# Patient Record
Sex: Male | Born: 1975 | Race: White | Hispanic: No | Smoking: Current every day smoker
Health system: Southern US, Community
[De-identification: ages and names within clinical notes are randomized; demographics above are authoritative.]

## PROBLEM LIST (undated history)

## (undated) DIAGNOSIS — F909 Attention-deficit hyperactivity disorder, unspecified type: Secondary | ICD-10-CM

## (undated) DIAGNOSIS — F319 Bipolar disorder, unspecified: Secondary | ICD-10-CM

## (undated) DIAGNOSIS — J45909 Unspecified asthma, uncomplicated: Secondary | ICD-10-CM

## (undated) HISTORY — PX: WISDOM TOOTH EXTRACTION: SHX21

---

## 2015-10-14 ENCOUNTER — Encounter: Payer: Self-pay | Admitting: Emergency Medicine

## 2015-10-14 ENCOUNTER — Emergency Department
Admission: EM | Admit: 2015-10-14 | Discharge: 2015-10-14 | Disposition: A | Discharge status: Home / Self Care | Payer: Medicaid Other | Attending: Emergency Medicine | Admitting: Emergency Medicine

## 2015-10-14 ENCOUNTER — Emergency Department: Payer: Medicaid Other

## 2015-10-14 DIAGNOSIS — S93401A Sprain of unspecified ligament of right ankle, initial encounter: Secondary | ICD-10-CM | POA: Diagnosis not present

## 2015-10-14 DIAGNOSIS — F17210 Nicotine dependence, cigarettes, uncomplicated: Secondary | ICD-10-CM | POA: Diagnosis not present

## 2015-10-14 DIAGNOSIS — Y9289 Other specified places as the place of occurrence of the external cause: Secondary | ICD-10-CM | POA: Insufficient documentation

## 2015-10-14 DIAGNOSIS — X58XXXA Exposure to other specified factors, initial encounter: Secondary | ICD-10-CM | POA: Diagnosis not present

## 2015-10-14 DIAGNOSIS — Y9389 Activity, other specified: Secondary | ICD-10-CM | POA: Insufficient documentation

## 2015-10-14 DIAGNOSIS — Y998 Other external cause status: Secondary | ICD-10-CM | POA: Insufficient documentation

## 2015-10-14 DIAGNOSIS — S99911A Unspecified injury of right ankle, initial encounter: Secondary | ICD-10-CM | POA: Diagnosis present

## 2015-10-14 HISTORY — DX: Unspecified asthma, uncomplicated: J45.909

## 2015-10-14 MED ORDER — HYDROCODONE-ACETAMINOPHEN 5-325 MG PO TABS
1.0000 | ORAL_TABLET | Freq: Once | ORAL | Status: AC
  Administered 2015-10-14: 1 via ORAL
  Filled 2015-10-14: qty 6

## 2015-10-14 MED ORDER — IBUPROFEN 800 MG PO TABS
800.0000 mg | ORAL_TABLET | Freq: Once | ORAL | Status: AC
  Administered 2015-10-14: 800 mg via ORAL
  Filled 2015-10-14: qty 1

## 2015-10-14 MED ORDER — IBUPROFEN 800 MG PO TABS: 800.0000 mg | ORAL_TABLET | Freq: Three times a day (TID) | ORAL | Status: DC | PRN

## 2015-10-14 NOTE — ED Notes (Signed)
Pt complains of right ankle pain, no injury to knowledge. Swelling noted, no deformity noted. Pt complains of pain only when weight bearing.

## 2015-10-14 NOTE — ED Provider Notes (Signed)
[facility] Emergency Department Provider Note  ____________________________________________  Time seen: Approximately 9:37 PM  I have reviewed the triage vital signs and the nursing notes.   HISTORY  Chief Complaint Ankle Pain    HPI Ronald Brooks is a 39 y.o. male who presents for evaluation of right ankle pain. No known trauma. Patient states that he is unsure of etiology but it is on his feet for 12 hours yesterday. Complains of pain only when weightbearing.   Past Medical History  Diagnosis Date  . Asthma     There are no active problems to display for this patient.   History reviewed. No pertinent past surgical history.  Current Outpatient Rx  Name  Route  Sig  Dispense  Refill  . ibuprofen (ADVIL,MOTRIN) 800 MG tablet   Oral   Take 1 tablet (800 mg total) by mouth every 8 (eight) hours as needed.   30 tablet   0     Allergies Review of patient's allergies indicates no known allergies.  History reviewed. No pertinent family history.  Social History Social History  Substance Use Topics  . Smoking status: Current Every Day Smoker -- 0.50 packs/day    Types: Cigarettes  . Smokeless tobacco: None  . Alcohol Use: Yes     Comment: occasional     Review of Systems Constitutional: No fever/chills Eyes: No visual changes. ENT: No sore throat. Cardiovascular: Denies chest pain. Respiratory: Denies shortness of breath. Gastrointestinal: No abdominal pain.  No nausea, no vomiting.  No diarrhea.  No constipation. Genitourinary: Negative for dysuria. Musculoskeletal: Positive for right ankle pain. Skin: Negative for rash. Neurological: Negative for headaches, focal weakness or numbness.  10-point ROS otherwise negative.  ____________________________________________   PHYSICAL EXAM:  VITAL SIGNS: ED Triage Vitals  Enc Vitals Group     BP 10/14/15 2049 115/77 mmHg     Pulse Rate 10/14/15 2049 92     Resp 10/14/15 2049 16     Temp 10/14/15 2049 98.2 F (36.8 C)     Temp Source 10/14/15 2049 Oral     SpO2 10/14/15 2049 96 %     Weight 10/14/15 2049 215 lb (97.523 kg)     Height 10/14/15 2049  (1.778 m)     Head Cir --      Peak Flow --      Pain Score 10/14/15 2050 7     Pain Loc --      Pain Edu? --      Excl. in GC? --     Constitutional: Alert and oriented. Well appearing and in no acute distress. Musculoskeletal: No lower extremity tenderness nor edema.  No joint effusions. Point tenderness laterally. Neurologic:  Normal speech and language. No gross focal neurologic deficits are appreciated. No gait instability. Skin:  Skin is warm, dry and intact. No rash noted. Psychiatric: Mood and affect are normal. Speech and behavior are normal.  ____________________________________________   LABS (all labs ordered are listed, but only abnormal results are displayed)  Labs Reviewed - No data to display  RADIOLOGY   ____________________________________________   PROCEDURES  Procedure(s) performed: None  Critical Care performed: No  ____________________________________________   INITIAL IMPRESSION / ASSESSMENT AND PLAN / ED COURSE  Pertinent labs & imaging results that were available during my care of the patient were reviewed by me and considered in my medical decision making (see chart for details).  Acute right ankle strain. Rx given for Motrin 800 mg 3 times a  day. Patient was given a ankle support brace and to follow-up with orthopedics on call. He voices no other emergency medical complaints at this time. ____________________________________________   FINAL CLINICAL IMPRESSION(S) / ED DIAGNOSES  Final diagnoses:  Ankle sprain, right, initial encounter      Evangeline DakinCharles M Laquitha Heslin, PA-C 10/15/15 1628  Rockne MenghiniAnne-Caroline Norman, MD 10/16/15 1553

## 2015-10-14 NOTE — Discharge Instructions (Signed)
Ankle Sprain  An ankle sprain is an injury to the strong, fibrous tissues (ligaments) that hold the bones of your ankle joint together.   CAUSES  An ankle sprain is usually caused by a fall or by twisting your ankle. Ankle sprains most commonly occur when you step on the outer edge of your foot, and your ankle turns inward. People who participate in sports are more prone to these types of injuries.   SYMPTOMS    Pain in your ankle. The pain may be present at rest or only when you are trying to stand or walk.   Swelling.   Bruising. Bruising may develop immediately or within 1 to 2 days after your injury.   Difficulty standing or walking, particularly when turning corners or changing directions.  DIAGNOSIS   Your caregiver will ask you details about your injury and perform a physical exam of your ankle to determine if you have an ankle sprain. During the physical exam, your caregiver will press on and apply pressure to specific areas of your foot and ankle. Your caregiver will try to move your ankle in certain ways. An X-ray exam may be done to be sure a bone was not broken or a ligament did not separate from one of the bones in your ankle (avulsion fracture).   TREATMENT   Certain types of braces can help stabilize your ankle. Your caregiver can make a recommendation for this. Your caregiver may recommend the use of medicine for pain. If your sprain is severe, your caregiver may refer you to a surgeon who helps to restore function to parts of your skeletal system (orthopedist) or a physical therapist.  HOME CARE INSTRUCTIONS    Apply ice to your injury for 1-2 days or as directed by your caregiver. Applying ice helps to reduce inflammation and pain.    Put ice in a plastic bag.    Place a towel between your skin and the bag.    Leave the ice on for 15-20 minutes at a time, every 2 hours while you are awake.   Only take over-the-counter or prescription medicines for pain, discomfort, or fever as directed by  your caregiver.   Elevate your injured ankle above the level of your heart as much as possible for 2-3 days.   If your caregiver recommends crutches, use them as instructed. Gradually put weight on the affected ankle. Continue to use crutches or a cane until you can walk without feeling pain in your ankle.   If you have a plaster splint, wear the splint as directed by your caregiver. Do not rest it on anything harder than a pillow for the first 24 hours. Do not put weight on it. Do not get it wet. You may take it off to take a shower or bath.   You may have been given an elastic bandage to wear around your ankle to provide support. If the elastic bandage is too tight (you have numbness or tingling in your foot or your foot becomes cold and blue), adjust the bandage to make it comfortable.   If you have an air splint, you may blow more air into it or let air out to make it more comfortable. You may take your splint off at night and before taking a shower or bath. Wiggle your toes in the splint several times per day to decrease swelling.  SEEK MEDICAL CARE IF:    You have rapidly increasing bruising or swelling.   Your toes feel   extremely cold or you lose feeling in your foot.   Your pain is not relieved with medicine.  SEEK IMMEDIATE MEDICAL CARE IF:   Your toes are numb or blue.   You have severe pain that is increasing.  MAKE SURE YOU:    Understand these instructions.   Will watch your condition.   Will get help right away if you are not doing well or get worse.     This information is not intended to replace advice given to you by your health care provider. Make sure you discuss any questions you have with your health care provider.     Document Released: 10/25/2005 Document Revised: 11/15/2014 Document Reviewed: 11/06/2011  Elsevier Interactive Patient Education 2016 [employer]

## 2017-02-18 IMAGING — CR DG ANKLE COMPLETE 3+V*R*
1 series · 3 of 3 positions shown · non-contrast
Comparison: None.

CLINICAL DATA: Acute onset of right ankle pain.  Initial encounter.

EXAM:
RIGHT ANKLE - COMPLETE 3+ VIEW

[Series 1: x ankle ap right · 0.14mm/px · 3 of 3 slices shown]
[im 1/3]
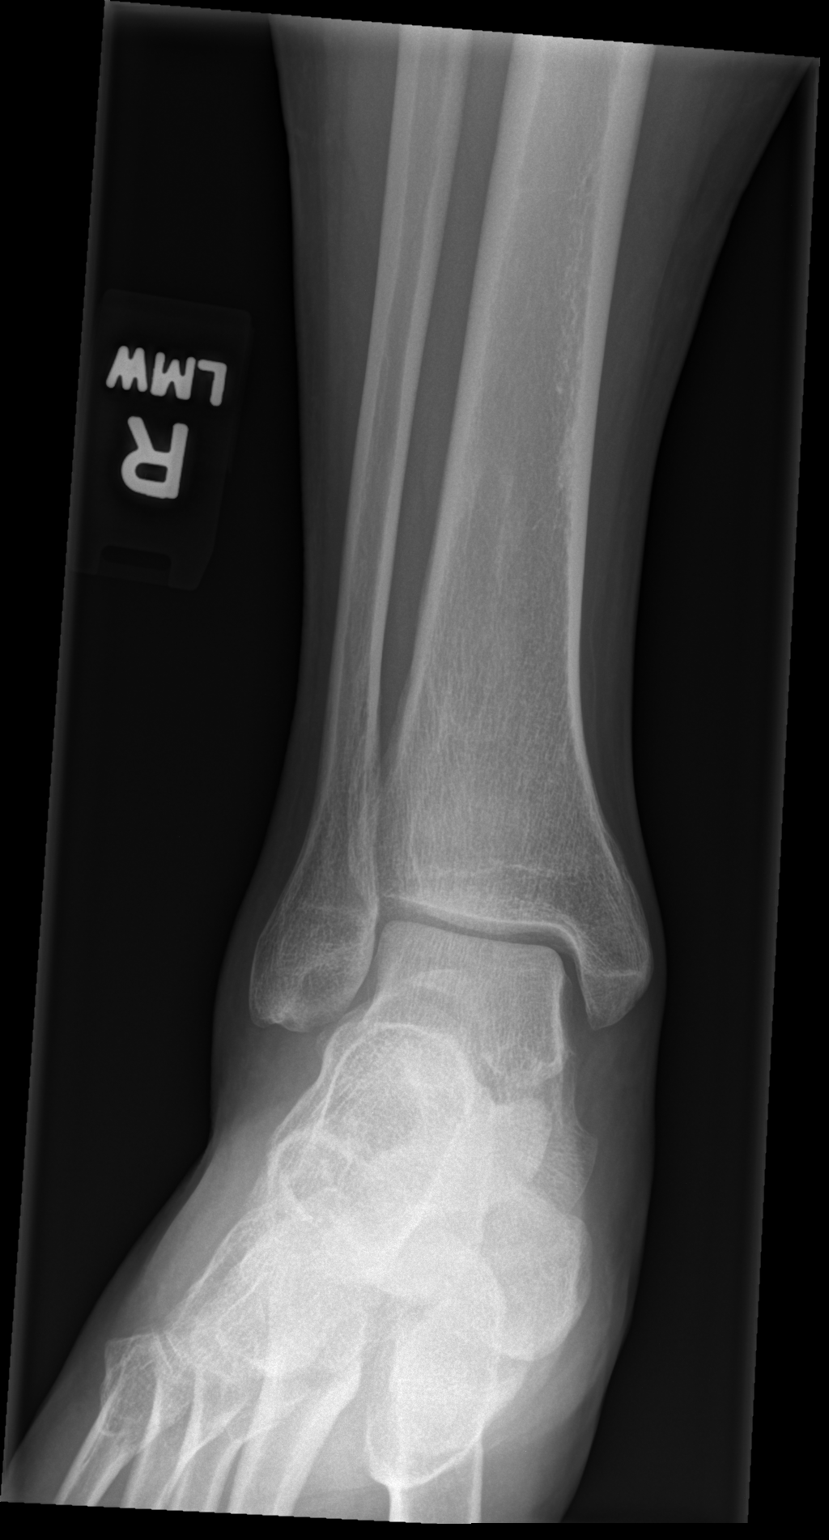
[im 2/3]
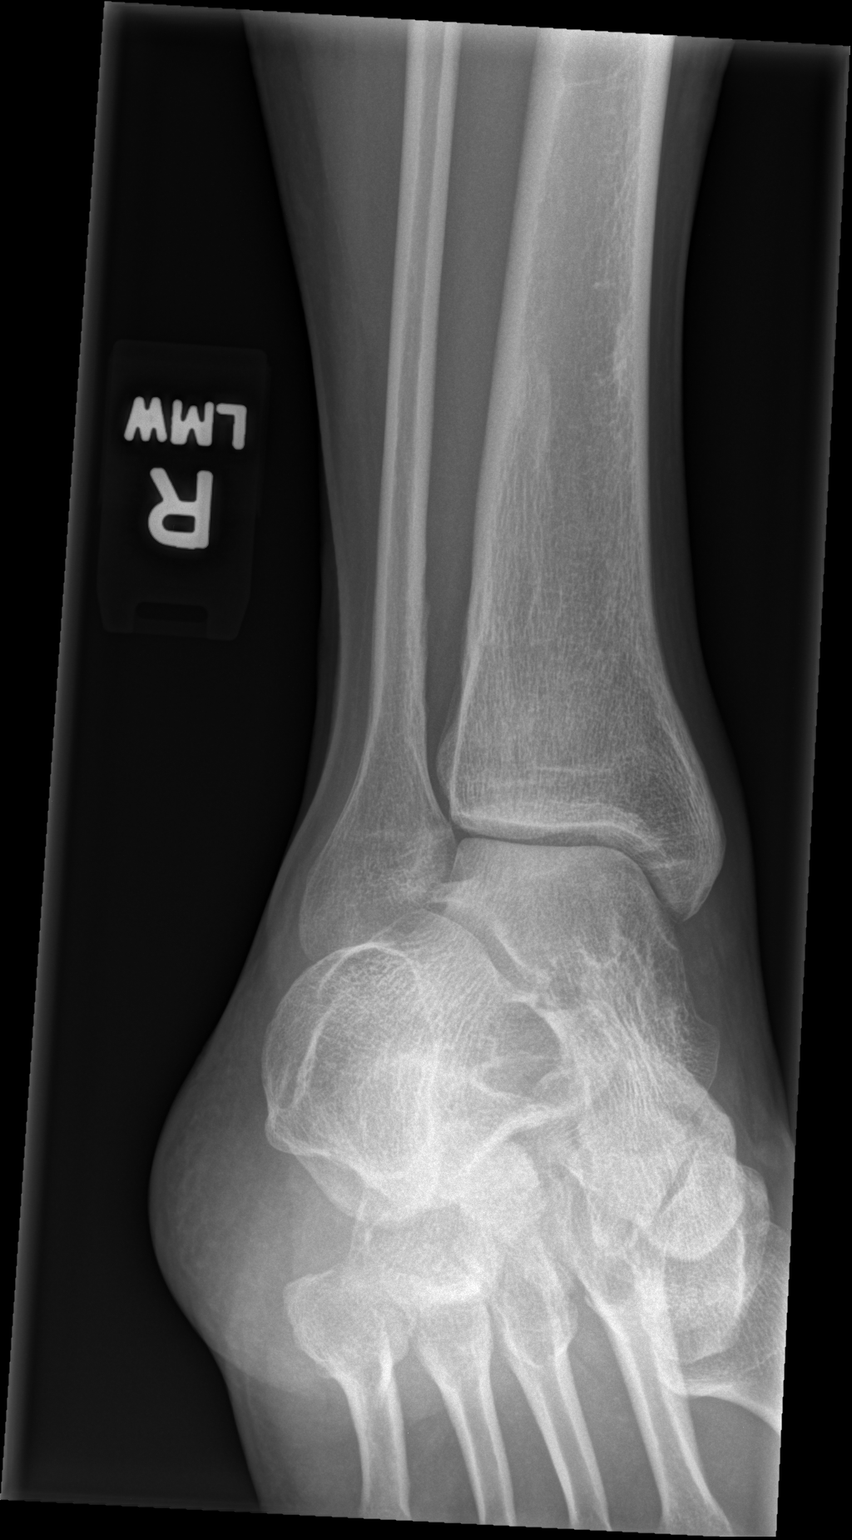
[im 3/3]
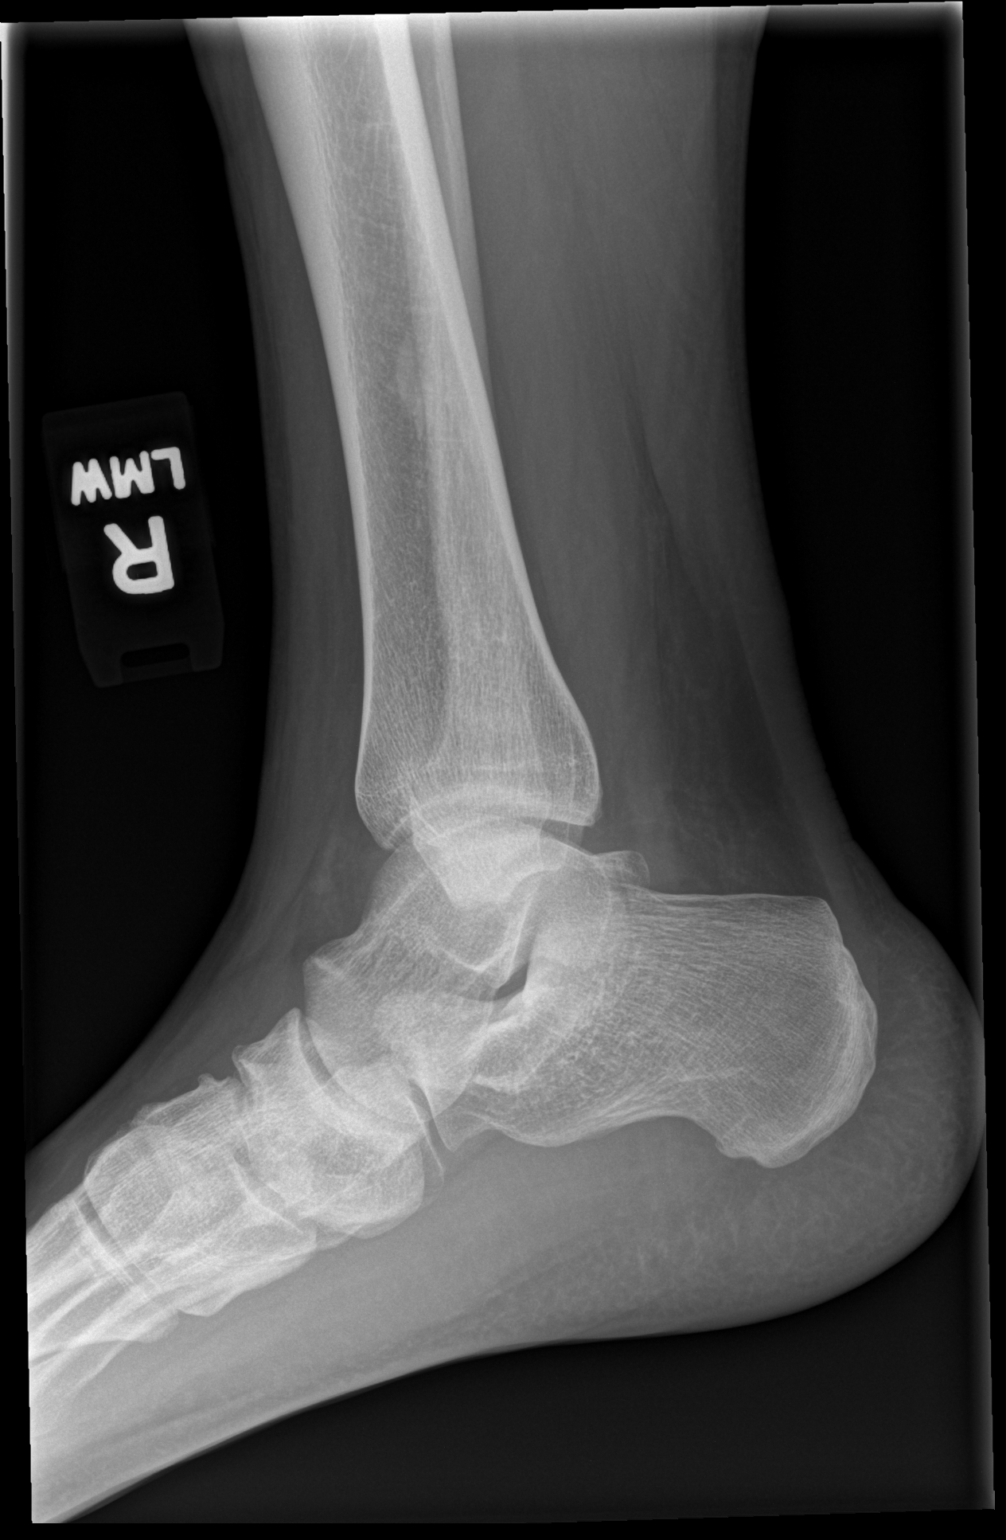

[3 of 3 positions shown; findings below may reference images not displayed]

FINDINGS: There is no evidence of fracture or dislocation. The ankle mortise
is intact; the interosseous space is within normal limits. No talar
tilt or subluxation is seen.

The joint spaces are preserved. Minimal dorsal and lateral soft
tissue swelling is noted.
IMPRESSION: No evidence of fracture or dislocation.

## 2018-09-03 ENCOUNTER — Encounter (HOSPITAL_COMMUNITY): Payer: Self-pay | Admitting: *Deleted

## 2018-09-03 ENCOUNTER — Emergency Department (HOSPITAL_COMMUNITY)
Admission: EM | Admit: 2018-09-03 | Discharge: 2018-09-05 | Disposition: A | Discharge status: Home / Self Care | Payer: Self-pay | Attending: Emergency Medicine | Admitting: Emergency Medicine

## 2018-09-03 ENCOUNTER — Other Ambulatory Visit: Payer: Self-pay

## 2018-09-03 DIAGNOSIS — F17210 Nicotine dependence, cigarettes, uncomplicated: Secondary | ICD-10-CM | POA: Insufficient documentation

## 2018-09-03 DIAGNOSIS — F330 Major depressive disorder, recurrent, mild: Secondary | ICD-10-CM

## 2018-09-03 DIAGNOSIS — J45909 Unspecified asthma, uncomplicated: Secondary | ICD-10-CM | POA: Insufficient documentation

## 2018-09-03 DIAGNOSIS — R45851 Suicidal ideations: Secondary | ICD-10-CM | POA: Insufficient documentation

## 2018-09-03 DIAGNOSIS — F314 Bipolar disorder, current episode depressed, severe, without psychotic features: Secondary | ICD-10-CM | POA: Insufficient documentation

## 2018-09-03 HISTORY — DX: Attention-deficit hyperactivity disorder, unspecified type: F90.9

## 2018-09-03 HISTORY — DX: Bipolar disorder, unspecified: F31.9

## 2018-09-03 LAB — COMPREHENSIVE METABOLIC PANEL
ALBUMIN: 4.4 g/dL (ref 3.5–5.0)
ALK PHOS: 53 U/L (ref 38–126)
ALT: 21 U/L (ref 0–44)
AST: 19 U/L (ref 15–41)
Anion gap: 6 (ref 5–15)
BILIRUBIN TOTAL: 0.5 mg/dL (ref 0.3–1.2)
BUN: 11 mg/dL (ref 6–20)
CALCIUM: 9.1 mg/dL (ref 8.9–10.3)
CO2: 24 mmol/L (ref 22–32)
CREATININE: 0.9 mg/dL (ref 0.61–1.24)
Chloride: 107 mmol/L (ref 98–111)
GFR calc Af Amer: >60 mL/min (ref >60)
GFR calc non Af Amer: >60 mL/min (ref >60)
Glucose, Bld: 88 mg/dL (ref 70–99)
Potassium: 4.4 mmol/L (ref 3.5–5.1)
SODIUM: 137 mmol/L (ref 135–145)
Total Protein: 7.5 g/dL (ref 6.5–8.1)

## 2018-09-03 LAB — CBC
HEMATOCRIT: 44.2 % (ref 39.0–52.0)
HEMOGLOBIN: 14.6 g/dL (ref 13.0–17.0)
MCH: 29.3 pg (ref 26.0–34.0)
MCHC: 33 g/dL (ref 30.0–36.0)
MCV: 88.6 fL (ref 80.0–100.0)
Platelets: 271 10*3/uL (ref 150–400)
RBC: 4.99 MIL/uL (ref 4.22–5.81)
RDW: 13.5 % (ref 11.5–15.5)
WBC: 10.7 10*3/uL — ABNORMAL HIGH (ref 4.0–10.5)
nRBC: 0 % (ref 0.0–0.2)

## 2018-09-03 LAB — RAPID URINE DRUG SCREEN, HOSP PERFORMED
AMPHETAMINES: NOT DETECTED
BARBITURATES: NOT DETECTED
Benzodiazepines: NOT DETECTED
Cocaine: NOT DETECTED
OPIATES: NOT DETECTED
Tetrahydrocannabinol: NOT DETECTED

## 2018-09-03 LAB — ETHANOL: Alcohol, Ethyl (B): <10 mg/dL (ref <10)

## 2018-09-03 LAB — SALICYLATE LEVEL: Salicylate Lvl: <7 mg/dL (ref 2.8–30.0)

## 2018-09-03 LAB — ACETAMINOPHEN LEVEL

## 2018-09-03 MED ORDER — OLANZAPINE 10 MG PO TABS
10.0000 mg | ORAL_TABLET | Freq: Two times a day (BID) | ORAL | Status: DC
  Administered 2018-09-03 – 2018-09-05 (×4): 10 mg via ORAL
  Filled 2018-09-03 (×4): qty 1

## 2018-09-03 NOTE — ED Notes (Signed)
Pt came out of his room abruptly and said that he felt restless and needs to move around. Transferred to the Acute Unit that is open so that he can walk around.
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
## 2018-09-03 NOTE — ED Triage Notes (Signed)
Pt states he is suicidal with a plan to cut self. Denies SI/HI

## 2018-09-03 NOTE — ED Notes (Addendum)
Pt to room # 43. Pt irritable. Informs this nurse that he was discharged from inpatient psychiatric treatment 7 days ago, and then went to a program called "[facility]" and lived at an apartment there, reports he did not like it there so he left. Pt reports he has been off his medication since discharging from the hospital because he can not afford them. Reports hx of bipolar disorder. Pt denies HI/AVH. Encouragement and support provided. Special checks q 15 mins in place for safety, Video monitoring in place. Will continue to monitor.

## 2018-09-03 NOTE — ED Notes (Signed)
Patient reports suicidal ideation with a plan to walk in front of a truck or cut himself. Patient denies HI/AVH. Plan of care discussed. Encouragement and support provided and safety maintain. Q 15 min safety checks remain in place.

## 2018-09-03 NOTE — ED Notes (Signed)
According to pt he was recently at [facility] and he was unable to get his medications filled at [facility]. He said, "You're not kicking me out of here are you?"

## 2018-09-04 ENCOUNTER — Other Ambulatory Visit: Payer: Self-pay

## 2018-09-04 DIAGNOSIS — R45851 Suicidal ideations: Secondary | ICD-10-CM

## 2018-09-04 DIAGNOSIS — F330 Major depressive disorder, recurrent, mild: Secondary | ICD-10-CM

## 2018-09-04 MED ORDER — DIVALPROEX SODIUM 500 MG PO DR TAB
500.0000 mg | DELAYED_RELEASE_TABLET | Freq: Two times a day (BID) | ORAL | Status: DC
  Administered 2018-09-04 – 2018-09-05 (×3): 500 mg via ORAL
  Filled 2018-09-04 (×3): qty 1

## 2018-09-04 NOTE — ED Notes (Signed)
Patient denies pain and is resting comfortably.  

## 2018-09-04 NOTE — Consult Note (Addendum)
[facility] Face-to-Face Psychiatry Consult   Reason for Consult:  Suicidal ideatoins Referring Physician:  EDP Patient Identification: Ronald Brooks MRN:  [id number] Principal Diagnosis: Major depressive disorder, recurrent episode, mild (Ronald Brooks) Diagnosis:   Patient Active Problem List   Diagnosis Date Noted  . Major depressive disorder, recurrent episode, mild (Ronald Brooks) [F33.0] 09/04/2018    Priority: High    Total Time spent with patient: 45 minutes  Subjective:   [name] is a 42 y.o. male patient will be started on medications and re-assessed for stability in the am.  HPI:  42 yo male who presented to the ED with suicidal ideations, intermittent, no plan.  His depression started a couple of months ago and increased recently.  He improved on medications at [facility] and was discharged a week ago but cannot get his medications.  Stress increases his depression and medications decrease it.  3/10 depression today.  Would like to restart his medications with hopes to discharge tomorrow.    Past Psychiatric History: depression  Risk to Self: Suicidal Ideation: Yes-Currently Present Suicidal Intent: Yes-Currently Present Is patient at risk for suicide?: Yes Suicidal Plan?: Yes-Currently Present Specify Current Suicidal Plan: cut self or walk in front of a truck Access to Means: Yes Specify Access to Suicidal Means: environment What has been your use of drugs/alcohol within the last 12 months?: no use in 29 days How many times?: (several) Other Self Harm Risks: none Triggers for Past Attempts: Unpredictable Intentional Self Injurious Behavior: None Risk to Others: Homicidal Ideation: No Thoughts of Harm to Others: No Current Homicidal Intent: No Current Homicidal Plan: No Access to Homicidal Means: No History of harm to others?: Yes("in self defense") Assessment of Violence: In distant past Does patient have access to weapons?: No Criminal Charges Pending?: No Does patient have a  court date: No Prior Inpatient Therapy: Prior Inpatient Therapy: Yes Prior Therapy Dates: discharged  7 days ago Prior Therapy Facilty/Provider(s): [facility] Reason for Treatment: MH and SA Prior Outpatient Therapy: Prior Outpatient Therapy: Yes Prior Therapy Dates: (this week) Prior Therapy Facilty/Provider(s): [facility] Reason for Treatment: MH, SA Does patient have an ACCT team?: No Does patient have Intensive In-House Services?  : No Does patient have [facility] services? : Yes Does patient have P4CC services?: No  Past Medical History:  Past Medical History:  Diagnosis Date  . ADHD   . Asthma   . Bipolar 1 disorder (Ronald Brooks)    History reviewed. No pertinent surgical history. Family History: No family history on file. Family Psychiatric  History: Maternal uncle-BPAD and completed suicide.  Social History:  Social History   Substance and Sexual Activity  Alcohol Use Yes   Comment: occasional      Social History   Substance and Sexual Activity  Drug Use No   Comment: has not used cocain and ETOH for 29 days    Social History   Socioeconomic History  . Marital status: Married    Spouse name: Not on file  . Number of children: Not on file  . Years of education: Not on file  . Highest education level: Not on file  Occupational History  . Not on file  Social Needs  . Financial resource strain: Not on file  . Food insecurity:    Worry: Not on file    Inability: Not on file  . Transportation needs:    Medical: Not on file    Non-medical: Not on file  Tobacco Use  . Smoking status: Current Every Day  Smoker    Packs/day: 0.50    Types: Cigarettes  . Smokeless tobacco: Never Used  Substance and Sexual Activity  . Alcohol use: Yes    Comment: occasional   . Drug use: No    Comment: has not used cocain and ETOH for 29 days  . Sexual activity: Not on file  Lifestyle  . Physical activity:    Days per week: Not on file    Minutes per session: Not on file  . Stress:  Not on file  Relationships  . Social connections:    Talks on phone: Not on file    Gets together: Not on file    Attends religious service: Not on file    Active member of club or organization: Not on file    Attends meetings of clubs or organizations: Not on file    Relationship status: Not on file  Other Topics Concern  . Not on file  Social History Narrative  . Not on file   Additional Social History: He is homeless. He denies alcohol or illicit substance use.     Allergies:  No Known Allergies  Labs:  Results for orders placed or performed during the hospital encounter of 09/03/18 (from the past 48 hour(s))  Comprehensive metabolic panel     Status: None   Collection Time: 09/03/18  3:22 PM  Result Value Ref Range   Sodium 137 135 - 145 mmol/L   Potassium 4.4 3.5 - 5.1 mmol/L   Chloride 107 98 - 111 mmol/L   CO2 24 22 - 32 mmol/L   Glucose, Bld 88 70 - 99 mg/dL   BUN 11 6 - 20 mg/dL   Creatinine, Ser 0.90 0.61 - 1.24 mg/dL   Calcium 9.1 8.9 - 10.3 mg/dL   Total Protein 7.5 6.5 - 8.1 g/dL   Albumin 4.4 3.5 - 5.0 g/dL   AST 19 15 - 41 U/L   ALT 21 0 - 44 U/L   Alkaline Phosphatase 53 38 - 126 U/L   Total Bilirubin 0.5 0.3 - 1.2 mg/dL   GFR calc non Af Amer >60 >60 mL/min   GFR calc Af Amer >60 >60 mL/min    Comment: (NOTE) The eGFR has been calculated using the CKD EPI equation. This calculation has not been validated in all clinical situations. eGFR's persistently <60 mL/min signify possible Chronic Kidney Disease.    Anion gap 6 5 - 15    Comment: Performed at [facility], [place] [address], [place], [place] [address]  Ethanol     Status: None   Collection Time: 09/03/18  3:22 PM  Result Value Ref Range   Alcohol, Ethyl (B) <10 <10 mg/dL    Comment: (NOTE) Lowest detectable limit for serum alcohol is 10 mg/dL. For medical purposes only. Performed at [facility], [place] [address], [place], [place] [address]   Salicylate  level     Status: None   Collection Time: 09/03/18  3:22 PM  Result Value Ref Range   Salicylate Lvl <4.6 2.8 - 30.0 mg/dL    Comment: Performed at [facility] [place] [address], [place], [place] [address]  Acetaminophen level     Status: Abnormal   Collection Time: 09/03/18  3:22 PM  Result Value Ref Range   Acetaminophen (Tylenol), Serum <10 (L) 10 - 30 ug/mL    Comment: (NOTE) Therapeutic concentrations vary significantly. A range of 10-30 ug/mL  may be an effective concentration for many patients.  However, some  are best treated at concentrations outside of this range. Acetaminophen concentrations >150 ug/mL at 4 hours after ingestion  and >50 ug/mL at 12 hours after ingestion are often associated with  toxic reactions. Performed at [facility], [place] [address], [place], [place] [address]   cbc     Status: Abnormal   Collection Time: 09/03/18  3:22 PM  Result Value Ref Range   WBC 10.7 (H) 4.0 - 10.5 K/uL   RBC 4.99 4.22 - 5.81 MIL/uL   Hemoglobin 14.6 13.0 - 17.0 g/dL   HCT 44.2 39.0 - 52.0 %   MCV 88.6 80.0 - 100.0 fL   MCH 29.3 26.0 - 34.0 pg   MCHC 33.0 30.0 - 36.0 g/dL   RDW 13.5 11.5 - 15.5 %   Platelets 271 150 - 400 K/uL   nRBC 0.0 0.0 - 0.2 %    Comment: Performed at [facility], [place] [address], [place], [place] [address]  Rapid urine drug screen (hospital performed)     Status: None   Collection Time: 09/03/18  3:22 PM  Result Value Ref Range   Opiates NONE DETECTED NONE DETECTED   Cocaine NONE DETECTED NONE DETECTED   Benzodiazepines NONE DETECTED NONE DETECTED   Amphetamines NONE DETECTED NONE DETECTED   Tetrahydrocannabinol NONE DETECTED NONE DETECTED   Barbiturates NONE DETECTED NONE DETECTED    Comment: (NOTE) DRUG SCREEN FOR MEDICAL PURPOSES ONLY.  IF CONFIRMATION IS NEEDED FOR ANY PURPOSE, NOTIFY LAB WITHIN 5 DAYS. LOWEST DETECTABLE LIMITS FOR URINE DRUG SCREEN Drug Class                      Cutoff (ng/mL) Amphetamine and metabolites    1000 Barbiturate and metabolites    200 Benzodiazepine                 098 Tricyclics and metabolites     300 Opiates and metabolites        300 Cocaine and metabolites        300 THC                            50 Performed at [facility], [place] [address] [place], [place] [address]     Current Facility-Administered Medications  Medication Dose Route Frequency Provider Last Rate Last Dose  . OLANZapine (ZYPREXA) tablet 10 mg  10 mg Oral BID Isla Pence, MD   10 mg at 09/04/18 1018   Current Outpatient Medications  Medication Sig Dispense Refill  . ibuprofen (ADVIL,MOTRIN) 800 MG tablet Take 1 tablet (800 mg total) by mouth every 8 (eight) hours as needed. (Patient not taking: Reported on 09/03/2018) 30 tablet 0    Musculoskeletal: Strength & Muscle Tone: within normal limits Gait & Station: normal Patient leans: N/A  Psychiatric Specialty Exam: Physical Exam  Nursing note and vitals reviewed. Constitutional: He is oriented to person, place, and time. He appears well-developed and well-nourished.  HENT:  Head: Normocephalic and atraumatic.  Neck: Normal range of motion.  Respiratory: Effort normal.  Musculoskeletal: Normal range of motion.  Neurological: He is alert and oriented to person, place, and time.  Psychiatric: His speech is normal and behavior is normal. Judgment and thought content normal. Cognition and memory are normal. He exhibits a depressed mood.    Review of Systems  Psychiatric/Behavioral: Positive for depression.  All other systems reviewed and are negative.  Blood pressure 120/83, pulse 64, temperature 97.8 F (36.6 C), temperature source Oral, resp. rate 16, height 5' 10.5" (1.791 m), weight 90.7 kg, SpO2 94 %.Body mass index is 28.29 kg/m.  General Appearance: Casual  Eye Contact:  Good  Speech:  Normal Rate  Volume:  Normal  Mood:  Depressed  Affect:  Congruent  Thought  Process:  Coherent and Descriptions of Associations: Intact  Orientation:  Full (Time, Place, and Person)  Thought Content:  WDL and Logical  Suicidal Thoughts:  Yes.  with intent/plan  Homicidal Thoughts:  No  Memory:  Immediate;   Good Recent;   Good Remote;   Good  Judgement:  Fair  Insight:  Fair  Psychomotor Activity:  Normal  Concentration:  Concentration: Good and Attention Span: Good  Recall:  Good  Fund of Knowledge:  Fair  Language:  Good  Akathisia:  No  Handed:  Right  AIMS (if indicated):   N/A  Assets:  Leisure Time Physical Health Resilience Social Support  ADL's:  Intact  Cognition:  WNL  Sleep:   N/A     Treatment Plan Summary: Daily contact with patient to assess and evaluate symptoms and progress in treatment, Medication management and Plan major depressive disorder, recurrent, mild with no psychosis:  -Started Depakote 500 mg BID for mood stabilization -Started Zyprexa 10 mg BID for mood stabilization -Will monitor overnight for safety.  Disposition: Supportive therapy provided about ongoing stressors.  Waylan Boga, NP 09/04/2018 11:06 AM   Patient seen face-to-face for psychiatric evaluation, chart reviewed and case discussed with the physician extender and developed treatment plan. Reviewed the information documented and agree with the treatment plan.  Buford Dresser, DO 09/04/18 3:21 PM

## 2018-09-04 NOTE — ED Notes (Signed)
Continues to have SI (thoughts), plan to a run in traffic or cut self. "I have been off my meds, in awhile."  He takes depakote and he does not remember the other meds. He has been out of meds r/t no funds. Denies HI/AVH.

## 2018-09-04 NOTE — ED Notes (Signed)
Patient continues to endorse SI but denies HI/AVH. Plan of care discussed. Encouragement and support provided and safety maintain. Q 15 min safety checks in place and video monitoring.

## 2018-09-04 NOTE — BH Assessment (Signed)
[facility] Assessment Progress Note  Per Juanetta Beets, DO, this voluntary pt is to be held at [facility] overnight for further observation and stabilization.  Pt's nurse, Angelique Blonder, has been notified.  Doylene Canning, [place] Behavioral Health Coordinator [phone]

## 2018-09-04 NOTE — ED Notes (Signed)
Pt will stay overnight for observation and med compliance

## 2018-09-05 DIAGNOSIS — R4587 Impulsiveness: Secondary | ICD-10-CM

## 2018-09-05 MED ORDER — OLANZAPINE 10 MG PO TABS: 10.0000 mg | ORAL_TABLET | Freq: Two times a day (BID) | ORAL | 0 refills | Status: DC

## 2018-09-05 MED ORDER — DIVALPROEX SODIUM 500 MG PO DR TAB: 500.0000 mg | DELAYED_RELEASE_TABLET | Freq: Two times a day (BID) | ORAL | 0 refills | Status: DC

## 2018-09-05 NOTE — ED Notes (Signed)
Patient denies pain and is resting comfortably.  

## 2018-09-05 NOTE — ED Notes (Signed)
he was given prescription and bus pass transporting him to [place].

## 2018-09-05 NOTE — BH Assessment (Signed)
[facility] Assessment Progress Note  Per Juanetta Beets, DO, this pt does not require psychiatric hospitalization at this time.  Pt is to be discharged from [facility] with recommendation to follow up with [facility].  This has been included in pt's discharge instructions.  Archie Balboa, LCSW agrees to provide pt with information regarding supportive services for the homeless.  Pt's nurse, Angelique Blonder, has been notified.  Doylene Canning, MA Triage Specialist [phone]

## 2018-09-05 NOTE — ED Notes (Signed)
Notes "SI, but contracts to safety." Pt notes he slept well throughout the night.

## 2018-09-05 NOTE — Consult Note (Addendum)
[facility] Psych ED Discharge  09/05/2018 10:50 AM Ronald Brooks  MRN:  [id number] Principal Problem: Major depressive disorder, recurrent episode, mild (HCC) Discharge Diagnoses:  Patient Active Problem List   Diagnosis Date Noted  . Major depressive disorder, recurrent episode, mild (HCC) [F33.0] 09/04/2018    Subjective:  Ronald Brooks presented to the hospital with SI in the setting of homelessness and discontinuing his medications since he was unable to afford them. He was restarted on his home medications yesterday and will be given resources for shelters. He does not endorse SI, HI or AVH. He is safe for discharge and will follow up with [facility].   Total Time spent with patient: 30 minutes  Past Psychiatric History: BPAD type 1 and ADHD.   Past Medical History:  Past Medical History:  Diagnosis Date  . ADHD   . Asthma   . Bipolar 1 disorder (HCC)    History reviewed. No pertinent surgical history. Family History: No family history on file. Family Psychiatric  History: Maternal uncle-BPAD and completed suicide.  Social History:  Social History   Substance and Sexual Activity  Alcohol Use Yes   Comment: occasional     Social History   Substance and Sexual Activity  Drug Use No   Comment: has not used cocain and ETOH for 29 days   Social History   Socioeconomic History  . Marital status: Married    Spouse name: Not on file  . Number of children: Not on file  . Years of education: Not on file  . Highest education level: Not on file  Occupational History  . Not on file  Social Needs  . Financial resource strain: Not on file  . Food insecurity:    Worry: Not on file    Inability: Not on file  . Transportation needs:    Medical: Not on file    Non-medical: Not on file  Tobacco Use  . Smoking status: Current Every Day Smoker    Packs/day: 0.50    Types: Cigarettes  . Smokeless tobacco: Never Used  Substance and Sexual Activity  . Alcohol use: Yes    Comment:  occasional   . Drug use: No    Comment: has not used cocain and ETOH for 29 days  . Sexual activity: Not on file  Lifestyle  . Physical activity:    Days per week: Not on file    Minutes per session: Not on file  . Stress: Not on file  Relationships  . Social connections:    Talks on phone: Not on file    Gets together: Not on file    Attends religious service: Not on file    Active member of club or organization: Not on file    Attends meetings of clubs or organizations: Not on file    Relationship status: Not on file  Other Topics Concern  . Not on file  Social History Narrative  . Not on file    Has this patient used any form of tobacco in the last 30 days? (Cigarettes, Smokeless Tobacco, Cigars, and/or Pipes) Prescription not provided because: N/A  Current Medications: Current Facility-Administered Medications  Medication Dose Route Frequency Provider Last Rate Last Dose  . divalproex (DEPAKOTE) DR tablet 500 mg  500 mg Oral Q12H Charm Rings, NP   500 mg at 09/05/18 1019  . OLANZapine (ZYPREXA) tablet 10 mg  10 mg Oral BID Jacalyn Lefevre, MD   10 mg at 09/05/18 1019   Current Outpatient  Medications  Medication Sig Dispense Refill  . divalproex (DEPAKOTE) 500 MG DR tablet Take 1 tablet (500 mg total) by mouth every 12 (twelve) hours. 10 tablet 0  . OLANZapine (ZYPREXA) 10 MG tablet Take 1 tablet (10 mg total) by mouth 2 (two) times daily. 10 tablet 0   PTA Medications:  (Not in a hospital admission)  Musculoskeletal: Strength & Muscle Tone: within normal limits Gait & Station: normal Patient leans: N/A  Psychiatric Specialty Exam: Physical Exam  Nursing note and vitals reviewed. Constitutional: He is oriented to person, place, and time. He appears well-developed and well-nourished.  HENT:  Head: Normocephalic and atraumatic.  Neck: Normal range of motion.  Respiratory: Effort normal.  Musculoskeletal: Normal range of motion.  Neurological: He is alert and  oriented to person, place, and time.  Psychiatric: His speech is normal and behavior is normal. Judgment and thought content normal. Cognition and memory are normal. He exhibits a depressed mood.    Review of Systems  Psychiatric/Behavioral: Negative for substance abuse and suicidal ideas. The patient has insomnia.   All other systems reviewed and are negative.   Blood pressure 130/89, pulse 82, temperature 97.6 F (36.4 C), temperature source Oral, resp. rate 20, height 5' 10.5" (1.791 m), weight 90.7 kg, SpO2 98 %.Body mass index is 28.29 kg/m.  General Appearance: Fairly Groomed, middle aged, Caucasian male, wearing paper hospital scrubs and lying in bed. NAD.   Eye Contact:  Good  Speech:  Clear and Coherent and Normal Rate  Volume:  Normal  Mood:  Depressed  Affect:  Constricted  Thought Process:  Goal Directed, Linear and Descriptions of Associations: Intact  Orientation:  Full (Time, Place, and Person)  Thought Content:  Logical  Suicidal Thoughts:  No  Homicidal Thoughts:  No  Memory:  Immediate;   Good Recent;   Good Remote;   Good  Judgement:  Fair  Insight:  Fair  Psychomotor Activity:  Normal  Concentration:  Concentration: Good and Attention Span: Good  Recall:  Good  Fund of Knowledge:  Good  Language:  Good  Akathisia:  No  Handed:  Right  AIMS (if indicated):   N/A  Assets:  Communication Skills  ADL's:  Intact  Cognition:  WNL  Sleep:   Fair     Demographic Factors:  Male, Caucasian, Low socioeconomic status and Unemployed  Loss Factors: Financial problems/change in socioeconomic status  Historical Factors: Prior suicide attempts, Family history of suicide and Impulsivity  Risk Reduction Factors:   Positive therapeutic relationship  Continued Clinical Symptoms:  Depression:   Impulsivity  Cognitive Features That Contribute To Risk:  None    Suicide Risk:  Minimal: No identifiable suicidal ideation.  Patients presenting with no risk factors  but with morbid ruminations; may be classified as minimal risk based on the severity of the depressive symptoms  Follow-up Information    Place[place], Chales Abrahams, NP Follow up.   Why:  Please go see Chales Abrahams at the [facility].  She can become your primary doctor and help with all kinds of other things. Contact information: [address] [place] [address] [phone]           Plan Of Care/Follow-up recommendations:  -Continue Depakote 500 mg BID for mood stabilization. -Continue Zyprexa 10 mg BID for mood stabilization. -Patient will follow up with [facility] for further medication management.   -SW will provide patient with shelter resources.   Disposition: Discharge home.  Cherly Beach, DO 09/05/2018, 10:50 AM

## 2018-09-05 NOTE — Discharge Instructions (Signed)
For your mental health needs, you are advised to follow up with [facility].  New and returning patients are seen at their walk-in clinic.  Walk-in hours are Monday - Friday from 8:00 am - 3:00 pm.  Walk-in patients are seen on a first come, first served basis.  Try to arrive as early as possible for he best chance of being seen the same day: ° °     [facility] °     [address] °     [place], [place] [address] °     [phone] °

## 2018-09-05 NOTE — ED Notes (Signed)
Pt was educated on discharge instructions and follow up appointment. He was given bus. Dr. [name] educated pt on resources. He noted that he will use those resources. Social worker spoke with patient, as well. Pt has all belongings.

## 2019-09-14 ENCOUNTER — Encounter (HOSPITAL_COMMUNITY): Payer: Self-pay | Admitting: Emergency Medicine

## 2019-09-14 ENCOUNTER — Other Ambulatory Visit: Payer: Self-pay

## 2019-09-14 ENCOUNTER — Emergency Department (HOSPITAL_COMMUNITY)
Admission: EM | Admit: 2019-09-14 | Discharge: 2019-09-14 | Disposition: A | Discharge status: Other Acute Inpatient Hospital | Payer: Self-pay | Attending: Emergency Medicine | Admitting: Emergency Medicine

## 2019-09-14 DIAGNOSIS — R45851 Suicidal ideations: Secondary | ICD-10-CM | POA: Insufficient documentation

## 2019-09-14 DIAGNOSIS — Z79899 Other long term (current) drug therapy: Secondary | ICD-10-CM | POA: Insufficient documentation

## 2019-09-14 DIAGNOSIS — J45909 Unspecified asthma, uncomplicated: Secondary | ICD-10-CM | POA: Insufficient documentation

## 2019-09-14 DIAGNOSIS — Z20828 Contact with and (suspected) exposure to other viral communicable diseases: Secondary | ICD-10-CM | POA: Insufficient documentation

## 2019-09-14 DIAGNOSIS — F1410 Cocaine abuse, uncomplicated: Secondary | ICD-10-CM

## 2019-09-14 DIAGNOSIS — F17210 Nicotine dependence, cigarettes, uncomplicated: Secondary | ICD-10-CM | POA: Insufficient documentation

## 2019-09-14 DIAGNOSIS — F319 Bipolar disorder, unspecified: Secondary | ICD-10-CM | POA: Insufficient documentation

## 2019-09-14 LAB — COMPREHENSIVE METABOLIC PANEL
ALT: 19 U/L (ref 0–44)
AST: 24 U/L (ref 15–41)
Albumin: 4.2 g/dL (ref 3.5–5.0)
Alkaline Phosphatase: 48 U/L (ref 38–126)
Anion gap: 13 (ref 5–15)
BUN: 16 mg/dL (ref 6–20)
CO2: 20 mmol/L — ABNORMAL LOW (ref 22–32)
Calcium: 9.3 mg/dL (ref 8.9–10.3)
Chloride: 104 mmol/L (ref 98–111)
Creatinine, Ser: 0.98 mg/dL (ref 0.61–1.24)
GFR calc Af Amer: >60 mL/min (ref >60)
GFR calc non Af Amer: >60 mL/min (ref >60)
Glucose, Bld: 161 mg/dL — ABNORMAL HIGH (ref 70–99)
Potassium: 3.7 mmol/L (ref 3.5–5.1)
Sodium: 137 mmol/L (ref 135–145)
Total Bilirubin: 0.7 mg/dL (ref 0.3–1.2)
Total Protein: 7.6 g/dL (ref 6.5–8.1)

## 2019-09-14 LAB — RAPID URINE DRUG SCREEN, HOSP PERFORMED
Amphetamines: NOT DETECTED
Barbiturates: NOT DETECTED
Benzodiazepines: NOT DETECTED
Cocaine: POSITIVE — AB
Opiates: NOT DETECTED
Tetrahydrocannabinol: NOT DETECTED

## 2019-09-14 LAB — CBC
HCT: 44.9 % (ref 39.0–52.0)
Hemoglobin: 14.7 g/dL (ref 13.0–17.0)
MCH: 29.6 pg (ref 26.0–34.0)
MCHC: 32.7 g/dL (ref 30.0–36.0)
MCV: 90.3 fL (ref 80.0–100.0)
Platelets: 409 10*3/uL — ABNORMAL HIGH (ref 150–400)
RBC: 4.97 MIL/uL (ref 4.22–5.81)
RDW: 12.8 % (ref 11.5–15.5)
WBC: 17.3 10*3/uL — ABNORMAL HIGH (ref 4.0–10.5)
nRBC: 0 % (ref 0.0–0.2)

## 2019-09-14 LAB — SARS CORONAVIRUS 2 (TAT 6-24 HRS): SARS Coronavirus 2: NEGATIVE

## 2019-09-14 LAB — ETHANOL: Alcohol, Ethyl (B): <10 mg/dL (ref <10)

## 2019-09-14 LAB — ACETAMINOPHEN LEVEL: Acetaminophen (Tylenol), Serum: <10 ug/mL — ABNORMAL LOW (ref 10–30)

## 2019-09-14 LAB — SALICYLATE LEVEL: Salicylate Lvl: <7 mg/dL (ref 2.8–30.0)

## 2019-09-14 MED ORDER — ALUM & MAG HYDROXIDE-SIMETH 200-200-20 MG/5ML PO SUSP: 30.0000 mL | Freq: Four times a day (QID) | ORAL | Status: DC | PRN

## 2019-09-14 MED ORDER — DIVALPROEX SODIUM 500 MG PO DR TAB
500.0000 mg | DELAYED_RELEASE_TABLET | Freq: Two times a day (BID) | ORAL | Status: DC
  Administered 2019-09-14 (×2): 500 mg via ORAL
  Filled 2019-09-14 (×2): qty 1

## 2019-09-14 MED ORDER — DIVALPROEX SODIUM 500 MG PO DR TAB
500.0000 mg | DELAYED_RELEASE_TABLET | Freq: Two times a day (BID) | ORAL | Status: DC
  Administered 2019-09-14: 500 mg via ORAL
  Filled 2019-09-14: qty 1

## 2019-09-14 MED ORDER — OLANZAPINE 10 MG PO TABS
10.0000 mg | ORAL_TABLET | Freq: Two times a day (BID) | ORAL | Status: DC
  Administered 2019-09-14 (×2): 10 mg via ORAL
  Filled 2019-09-14 (×2): qty 1

## 2019-09-14 MED ORDER — ACETAMINOPHEN 325 MG PO TABS: 650.0000 mg | ORAL_TABLET | ORAL | Status: DC | PRN

## 2019-09-14 MED ORDER — ONDANSETRON HCL 4 MG PO TABS: 4.0000 mg | ORAL_TABLET | Freq: Three times a day (TID) | ORAL | Status: DC | PRN

## 2019-09-14 MED ORDER — MELATONIN 3 MG PO TABS: 3.0000 mg | ORAL_TABLET | Freq: Every evening | ORAL | Status: DC | PRN

## 2019-09-14 MED ORDER — OLANZAPINE 10 MG PO TABS
10.0000 mg | ORAL_TABLET | Freq: Two times a day (BID) | ORAL | Status: DC
  Administered 2019-09-14: 10 mg via ORAL
  Filled 2019-09-14: qty 1

## 2019-09-14 NOTE — ED Notes (Signed)
Voluntary consent signed by patient and witnessed by this nurse. Consent faxed to [facility] and original placed in patient folder to be sent when patient transfes.

## 2019-09-14 NOTE — ED Notes (Signed)
Called transport [phone]   pt moving to [facility]

## 2019-09-14 NOTE — ED Notes (Signed)
Called report to [facility] adult unit

## 2019-09-14 NOTE — BH Assessment (Signed)
[facility] Assessment Progress Note  Per Letitia Libra, FNP, this pt is to be transferred to the [facility] Observation Unit at this time.  Heather, RN has assigned pt to Obs Rm 204.  [facility] will be ready to receive pt after 19:00.  Pt has signed Voluntary Admission and Consent for Treatment and signed form has been faxed to [facility].  Pt's nurse has been notified, and agrees to send original paperwork along with pt via [facility], and to call report to [phone].  Jalene Mullet, [place] Coordinator [phone]

## 2019-09-14 NOTE — Patient Outreach (Addendum)
CPSS tried to meet with the patient again in order to complete the CPSS assessment and talk to the patient about available substance use recovery resources, but the patient is still unable to participate in the CPSS assessment. Patient is still very sleepy at this time. CPSS already provided the patient with information for substance use recovery resources today 09/14/19 at around 12:20pm and left these resources at bedside. Patient is still being  recommended for an inpatient psychiatric hospital admission at this time.

## 2019-09-14 NOTE — Patient Outreach (Signed)
CPSS tried meeting with the patient for the third time today 09/14/19 and the patient was still unable to participate in the CPSS assessment. Patient is still very sleepy at this time. Patient stated that wanted to continue to sleep. Substance use recovery resources were already provided at beside including residential/outpatient substance use treatment center list, NA/AA meeting list, and CPSS contact information.

## 2019-09-14 NOTE — ED Notes (Signed)
Pt belongings have been moved to [employer] [facility]

## 2019-09-14 NOTE — BHH Counselor (Signed)
Clinician to assess pt once he's been seen by EDP and medically cleared.    Vertell Novak, [employer], [facility], [facility] Triage Specialist [phone]

## 2019-09-14 NOTE — BHH Counselor (Signed)
Pt denies, having family, friend supports.   Teshara Moree D Rael Tilly, MS, LCMHC, CRC Triage Specialist [phone]  
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
## 2019-09-14 NOTE — ED Notes (Signed)
[facility] called  Pt to go to room 302  Bed 1 Dr. Mallie Darting

## 2019-09-14 NOTE — Progress Notes (Signed)
Received Ronald Brooks from the main ED an escort, he was oriented to his new environment. He is angry within related to personal stressors and being unable to sleep for days. He was given a dose of his prior medications. The PA arrival to talk with patient. He eventually drifted off to sleep after receiving a  Snack.

## 2019-09-14 NOTE — Consult Note (Signed)
Telepsych Consultation   Reason for Consult:  Suicidal Ideations Referring Physician:  EDP Location of Patient: [name] Location of Provider: [facility]  Patient Identification: Ronald Brooks MRN:  [id number] Principal Diagnosis: Cocaine use disorder (HCC) Diagnosis:  Principal Problem:   Cocaine use disorder (HCC)   Total Time spent with patient: 30 minutes  Subjective:   Ronald Brooks is a 43 y.o. male patient admitted with suicidal ideations. Patient alert and oriented for assessment. Patient irritable, verbalizes "Are you going to discharge me?" Patient endorses chronic suicidal thoughts "for a few months."  Patient endorses plan to cut self. Patient denies homicidal ideations, denies hallucinations. Patient endorses one previous suicide attempt 1.5 years ago when he cut himself. Patient reports inpatient at [facility] one month ago, left [facility] went to [place] for 9 days, most recently at [facility] 2 weeks ago. Patient stressors include "I stay in transitional housing with a bunch of addicts and I am not allowed back there, I used last night." Patient recently in mentorship program in [place], dismissed from program. 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 Patient states Depakote and Zyprexa have worked well in the past.   HPI:  Per TTS assessment: Pt reported, he thought about taking a knife going into his room to cut himself so he can get peace. Pt reported, he thought about cutting himself so he can pass out and get some sleep. Pt reported, "I want to go to sleep and never wake up." Pt reported, he's been out of his medications for two months (Depakote and Zyprexa). Pt reported, he goes from depressed to manic. Pt reported, his  symptoms manifest physically, he was sick the week before last. Pt reported, he cut himself a year and a half ago, as a suicide attempt. Pt reported, access to knives. Pt reported, denies, HI, AVH, and current self-injurious behaviors.   Past Psychiatric History: Bipolar Disorder (per patient)  Risk to Self: Suicidal Ideation: Yes-Currently Present Suicidal Intent: Yes-Currently Present Is patient at risk for suicide?: Yes Suicidal Plan?: Yes-Currently Present Specify Current Suicidal Plan: Pt reported, to cut himself with a knife.  Access to Means: Yes Specify Access to Suicidal Means: Access to knives.  What has been your use of drugs/alcohol within the last 12 months?: Cocaine.  How many times?: 1 Other Self Harm Risks: Cutting.  Triggers for Past Attempts: Unknown Intentional Self Injurious Behavior: Cutting Comment - Self Injurious Behavior: Pt reported, cutting a year and a half ago.  Risk to Others: Homicidal Ideation: No(Pt denies. ) Thoughts of Harm to Others: No(Pt denies. ) Current Homicidal Intent: No Current Homicidal Plan: No Access to Homicidal Means: No Identified Victim: NA History of harm to others?: No Assessment of Violence: None Noted Violent Behavior Description: NA Does patient have access to weapons?: No(Pt denies. ) Criminal Charges Pending?: No Does patient have a court date: No Prior Inpatient Therapy: Prior Inpatient Therapy: Yes Prior Therapy Dates: 08/2018 Prior Therapy Facilty/Provider(s): [facility].  Reason for Treatment: Bipolar Disorder Prior Outpatient Therapy: Prior Outpatient Therapy: No Does patient have an ACCT team?: No Does patient have Intensive In-House Services?  : No Does patient have [facility] services? : No Does patient have P4CC services?: No  Past Medical History:  Past Medical History:  Diagnosis Date  .  ADHD   . Asthma   . Bipolar 1 disorder (HCC)    History reviewed. No pertinent surgical history. Family  History: History reviewed. No pertinent family history. Family Psychiatric  History: Unknown Social History:  Social History   Substance and Sexual Activity  Alcohol Use Yes   Comment: occasional      Social History   Substance and Sexual Activity  Drug Use No   Comment: has not used cocain and ETOH for 29 days    Social History   Socioeconomic History  . Marital status: Legally Separated    Spouse name: Not on file  . Number of children: Not on file  . Years of education: Not on file  . Highest education level: Not on file  Occupational History  . Not on file  Social Needs  . Financial resource strain: Not on file  . Food insecurity    Worry: Not on file    Inability: Not on file  . Transportation needs    Medical: Not on file    Non-medical: Not on file  Tobacco Use  . Smoking status: Current Every Day Smoker    Packs/day: 0.50    Types: Cigarettes  . Smokeless tobacco: Never Used  Substance and Sexual Activity  . Alcohol use: Yes    Comment: occasional   . Drug use: No    Comment: has not used cocain and ETOH for 29 days  . Sexual activity: Not on file  Lifestyle  . Physical activity    Days per week: Not on file    Minutes per session: Not on file  . Stress: Not on file  Relationships  . Social Musician on phone: Not on file    Gets together: Not on file    Attends religious service: Not on file    Active member of club or organization: Not on file    Attends meetings of clubs or organizations: Not on file    Relationship status: Not on file  Other Topics Concern  . Not on file  Social History Narrative  . Not on file   Additional Social History:    Allergies:   Allergies  Allergen Reactions  . Other Shortness Of Breath    "dander"  . Vitamin E Rash    Labs:  Results for orders placed or performed during the hospital encounter of 09/14/19 (from the past 48 hour(s))  Comprehensive metabolic panel     Status: Abnormal    Collection Time: 09/14/19  1:35 AM  Result Value Ref Range   Sodium 137 135 - 145 mmol/L   Potassium 3.7 3.5 - 5.1 mmol/L   Chloride 104 98 - 111 mmol/L   CO2 20 (L) 22 - 32 mmol/L   Glucose, Bld 161 (H) 70 - 99 mg/dL   BUN 16 6 - 20 mg/dL   Creatinine, Ser 2.95 0.61 - 1.24 mg/dL   Calcium 9.3 8.9 - 62.1 mg/dL   Total Protein 7.6 6.5 - 8.1 g/dL   Albumin 4.2 3.5 - 5.0 g/dL   AST 24 15 - 41 U/L   ALT 19 0 - 44 U/L   Alkaline Phosphatase 48 38 - 126 U/L   Total Bilirubin 0.7 0.3 - 1.2 mg/dL   GFR calc non Af Amer >60 >60 mL/min   GFR calc Af Amer >60 >60 mL/min   Anion gap 13 5 - 15    Comment: Performed at [facility], [address]  [place] [place], [place] [address]  Ethanol     Status: None   Collection Time: 09/14/19  1:35 AM  Result Value Ref Range   Alcohol, Ethyl (B) <10 <10 mg/dL    Comment: (NOTE) Lowest detectable limit for serum alcohol is 10 mg/dL. For medical purposes only. Performed at [facility], [place] [address], [place], [place] [address]   Salicylate level     Status: None   Collection Time: 09/14/19  1:35 AM  Result Value Ref Range   Salicylate Lvl <7.7 2.8 - 30.0 mg/dL    Comment: Performed at [facility], [place] [address] [place], [place] [address]  Acetaminophen level     Status: Abnormal   Collection Time: 09/14/19  1:35 AM  Result Value Ref Range   Acetaminophen (Tylenol), Serum <10 (L) 10 - 30 ug/mL    Comment: (NOTE) Therapeutic concentrations vary significantly. A range of 10-30 ug/mL  may be an effective concentration for many patients. However, some  are best treated at concentrations outside of this range. Acetaminophen concentrations >150 ug/mL at 4 hours after ingestion  and >50 ug/mL at 12 hours after ingestion are often associated with  toxic reactions. Performed at [facility], [place] [address], [place], [place] [address]   cbc     Status: Abnormal   Collection  Time: 09/14/19  1:35 AM  Result Value Ref Range   WBC 17.3 (H) 4.0 - 10.5 K/uL   RBC 4.97 4.22 - 5.81 MIL/uL   Hemoglobin 14.7 13.0 - 17.0 g/dL   HCT 44.9 39.0 - 52.0 %   MCV 90.3 80.0 - 100.0 fL   MCH 29.6 26.0 - 34.0 pg   MCHC 32.7 30.0 - 36.0 g/dL   RDW 12.8 11.5 - 15.5 %   Platelets 409 (H) 150 - 400 K/uL   nRBC 0.0 0.0 - 0.2 %    Comment: Performed at [facility], [place] [address], [place], [place] [address]  Rapid urine drug screen (hospital performed)     Status: Abnormal   Collection Time: 09/14/19  1:35 AM  Result Value Ref Range   Opiates NONE DETECTED NONE DETECTED   Cocaine POSITIVE (A) NONE DETECTED   Benzodiazepines NONE DETECTED NONE DETECTED   Amphetamines NONE DETECTED NONE DETECTED   Tetrahydrocannabinol NONE DETECTED NONE DETECTED   Barbiturates NONE DETECTED NONE DETECTED    Comment: (NOTE) DRUG SCREEN FOR MEDICAL PURPOSES ONLY.  IF CONFIRMATION IS NEEDED FOR ANY PURPOSE, NOTIFY LAB WITHIN 5 DAYS. LOWEST DETECTABLE LIMITS FOR URINE DRUG SCREEN Drug Class                     Cutoff (ng/mL) Amphetamine and metabolites    1000 Barbiturate and metabolites    200 Benzodiazepine                 096 Tricyclics and metabolites     300 Opiates and metabolites        300 Cocaine and metabolites        300 THC                            50 Performed at [facility], [place] [address], [place], [place] [address]     Medications:  Current Facility-Administered Medications  Medication Dose Route Frequency Provider Last Rate Last Dose  . acetaminophen (TYLENOL) tablet 650 mg  650 mg Oral Q4H PRN  Terald Sleeperrifan, Matthew J, MD      . alum & mag hydroxide-simeth (MAALOX/MYLANTA) 200-200-20 MG/5ML suspension 30 mL  30 mL Oral Q6H PRN Terald Sleeperrifan, Matthew J, MD      . divalproex (DEPAKOTE) DR tablet 500 mg  500 mg Oral Q12H Antony MaduraHumes, Kelly, PA-C   500 mg at 09/14/19 1153  . divalproex (DEPAKOTE) DR tablet 500 mg  500 mg Oral Q12H Trifan, Kermit BaloMatthew J, MD       . Melatonin TABS 3 mg  3 mg Oral QHS PRN Antony MaduraHumes, Kelly, PA-C      . OLANZapine (ZYPREXA) tablet 10 mg  10 mg Oral BID Terald Sleeperrifan, Matthew J, MD      . ondansetron [facility](ZOFRAN) tablet 4 mg  4 mg Oral Q8H PRN Terald Sleeperrifan, Matthew J, MD       Current Outpatient Medications  Medication Sig Dispense Refill  . albuterol (VENTOLIN HFA) 108 (90 Base) MCG/ACT inhaler Inhale 1-2 puffs into the lungs every 6 (six) hours as needed for wheezing or shortness of breath.    . divalproex (DEPAKOTE) 500 MG DR tablet Take 1 tablet (500 mg total) by mouth every 12 (twelve) hours. 10 tablet 0  . OLANZapine (ZYPREXA) 10 MG tablet Take 1 tablet (10 mg total) by mouth 2 (two) times daily. 10 tablet 0    Musculoskeletal: Strength & Muscle Tone: within normal limits Gait & Station: normal Patient leans: N/A  Psychiatric Specialty Exam: Physical Exam  Nursing note and vitals reviewed. Constitutional: He is oriented to person, place, and time. He appears well-developed.  HENT:  Head: Normocephalic.  Cardiovascular: Normal rate.  Respiratory: Effort normal.  Neurological: He is alert and oriented to person, place, and time.  Psychiatric: His speech is normal. Judgment normal. His affect is angry. He is agitated. Cognition and memory are normal. He expresses suicidal ideation.    Review of Systems  Constitutional: Negative.   HENT: Negative.   Eyes: Negative.   Respiratory: Negative.   Cardiovascular: Negative.   Gastrointestinal: Negative.   Genitourinary: Negative.   Musculoskeletal: Negative.   Skin: Negative.   Neurological: Negative.   Endo/Heme/Allergies: Negative.   Psychiatric/Behavioral: Positive for substance abuse and suicidal ideas.    Blood pressure 114/74, pulse 70, temperature 98.3 F (36.8 C), temperature source Oral, resp. rate 16, SpO2 95 %.There is no height or weight on file to calculate BMI.  General Appearance: Casual  Eye Contact:  Minimal  Speech:  Clear and Coherent  Volume:   Increased  Mood:  Irritable  Affect:  Labile  Thought Process:  Coherent and Descriptions of Associations: Intact  Orientation:  Full (Time, Place, and Person)  Thought Content:  Logical  Suicidal Thoughts:  Yes.  with intent/plan  Homicidal Thoughts:  No  Memory:  Immediate;   Good Recent;   Good Remote;   Good  Judgement:  Good  Insight:  Fair  Psychomotor Activity:  Normal  Concentration:  Concentration: Good and Attention Span: Good  Recall:  Good  Fund of Knowledge:  Good  Language:  Good  Akathisia:  No  Handed:  Right  AIMS (if indicated):     Assets:  Communication Skills Housing Social Support Talents/Skills Vocational/Educational  ADL's:  Intact  Cognition:  WNL  Sleep:        Treatment Plan Summary: Plan admit to observation  Disposition: Recommend psychiatric Inpatient admission when medically cleared.  This service was provided via telemedicine using a 2-way, interactive audio and video technology.  Names of all persons participating  in this telemedicine service and their role in this encounter. Name: Nena Jordan Role: Patient  Name: Berneice Heinrich Role: FNP    Patrcia Dolly, FNP 09/14/2019 5:07 PM

## 2019-09-14 NOTE — ED Provider Notes (Signed)
[facility] DEPT Provider Note   CSN: [id number] Arrival date & time: 09/14/19  0117     History   Chief Complaint Chief Complaint  Patient presents with  . Suicidal    HPI Ronald Brooks is a 43 y.o. male.     43 year old male with a history of bipolar 1 disorder presents to the emergency department for psychiatric evaluation.  Reports suicidal ideations tonight with plan to cut himself.  Does endorse history of past suicide attempt, but no self-harm this evening.  Previously sober from illicit substances for 2 months, but reports use of cocaine tonight.  States that he has not been able to sleep in 1 week and cocaine will sometimes help him with his mania.  He has been noncompliant with his psychiatric medications x2 months and has been rotating through various sober living facilities.  No homicidal ideations, hallucinations.  The history is provided by the patient. No language interpreter was used.    Past Medical History:  Diagnosis Date  . ADHD   . Asthma   . Bipolar 1 disorder [facility])     Patient Active Problem List   Diagnosis Date Noted  . Major depressive disorder, recurrent episode, mild ([place]) 09/04/2018    History reviewed. No pertinent surgical history.      Home Medications    Prior to Admission medications   Medication Sig Start Date End Date Taking? Authorizing Provider  albuterol (VENTOLIN HFA) 108 (90 Base) MCG/ACT inhaler Inhale 1-2 puffs into the lungs every 6 (six) hours as needed for wheezing or shortness of breath.   Yes [provider]  divalproex (DEPAKOTE) 500 MG DR tablet Take 1 tablet (500 mg total) by mouth every 12 (twelve) hours. 09/05/18   Starkes-Perry, Gayland Curry, FNP  OLANZapine (ZYPREXA) 10 MG tablet Take 1 tablet (10 mg total) by mouth 2 (two) times daily. 09/05/18   Suella Broad, FNP    Family History History reviewed. No pertinent family history.  Social History Social History    Tobacco Use  . Smoking status: Current Every Day Smoker    Packs/day: 0.50    Types: Cigarettes  . Smokeless tobacco: Never Used  Substance Use Topics  . Alcohol use: Yes    Comment: occasional   . Drug use: No    Comment: has not used cocain and ETOH for 29 days     Allergies   Other and Vitamin e   Review of Systems Review of Systems Ten systems reviewed and are negative for acute change, except as noted in the HPI.    Physical Exam Updated Vital Signs BP 129/90 (BP Location: Left Arm)   Pulse (!) 104   Temp 98.3 F (36.8 C) (Oral)   Resp 16   SpO2 98%   Physical Exam Vitals signs and nursing note reviewed.  Constitutional:      General: He is not in acute distress.    Appearance: He is well-developed. He is not diaphoretic.  HENT:     Head: Normocephalic and atraumatic.  Eyes:     General: No scleral icterus.    Conjunctiva/sclera: Conjunctivae normal.  Neck:     Musculoskeletal: Normal range of motion.  Pulmonary:     Effort: Pulmonary effort is normal. No respiratory distress.  Musculoskeletal: Normal range of motion.  Skin:    General: Skin is warm and dry.     Coloration: Skin is not pale.     Findings: No erythema or rash.  Neurological:  Mental Status: He is alert and oriented to person, place, and time.  Psychiatric:        Speech: Speech normal.        Behavior: Behavior is agitated.        Thought Content: Thought content includes suicidal ideation. Thought content does not include homicidal ideation. Thought content includes suicidal plan.     Comments: Appears to be slightly manic      ED Treatments / Results  Labs (all labs ordered are listed, but only abnormal results are displayed) Labs Reviewed  COMPREHENSIVE METABOLIC PANEL - Abnormal; Notable for the following components:      Result Value   CO2 20 (*)    Glucose, Bld 161 (*)    All other components within normal limits  ACETAMINOPHEN LEVEL - Abnormal; Notable for the  following components:   Acetaminophen (Tylenol), Serum <10 (*)    All other components within normal limits  CBC - Abnormal; Notable for the following components:   WBC 17.3 (*)    Platelets 409 (*)    All other components within normal limits  RAPID URINE DRUG SCREEN, HOSP PERFORMED - Abnormal; Notable for the following components:   Cocaine POSITIVE (*)    All other components within normal limits  SARS CORONAVIRUS 2 (TAT 6-24 HRS)  ETHANOL  SALICYLATE LEVEL    EKG None  Radiology No results found.  Procedures Procedures (including critical care time)  Medications Ordered in ED Medications  divalproex (DEPAKOTE) DR tablet 500 mg (500 mg Oral Given 09/14/19 0231)  OLANZapine (ZYPREXA) tablet 10 mg (10 mg Oral Given 09/14/19 0230)  Melatonin TABS 3 mg (has no administration in time range)     Initial Impression / Assessment and Plan / ED Course  I have reviewed the triage vital signs and the nursing notes.  Pertinent labs & imaging results that were available during my care of the patient were reviewed by me and considered in my medical decision making (see chart for details).        43 year old male presents the emergency department for psychiatric evaluation.  Has been off of his psychiatric medications x2 months.  Previously sober from illicit substances, but did use cocaine today.  Reports suicidal ideations with plan of self-mutilation.  Patient meets criteria for inpatient treatment following evaluation with TTS.  No beds available at this time.  TTS to seek placement.  Disposition to be determined by oncoming ED provider.   Final Clinical Impressions(s) / ED Diagnoses   Final diagnoses:  Bipolar 1 disorder [facility])    ED Discharge Orders    None       Antony Madura, PA-C 09/14/19 4854    Palumbo, April, MD 09/14/19 [phone]

## 2019-09-14 NOTE — [facility] Counselor (Signed)
Per Nira Conn, [facility] patient accepted to [facility] Observation until 204 after 1900. Accepting provider is Letitia Libra, FNP. Night shift AC to coordinate.

## 2019-09-14 NOTE — Patient Outreach (Addendum)
CPSS tried meeting with the patient in order to provide substance use recovery support and provide information for substance use recovery resources, but the patient was unable to participate in the CPSS assessment. Patient stated that he wanted to continue to sleep and could not participate in the CPSS assessment. CPSS left information for substance use recovery resources at beside. Some of these resources include residential/outpatient substance use treatment center list, NA meeting list, and CPSS contact information. 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 Patient's UDS is positive for cocaine. Patient currently has been recommended for an inpatient psychiatric hospital admission at this time.

## 2019-09-14 NOTE — ED Notes (Signed)
ED TO INPATIENT HANDOFF REPORT  ED Nurse Name and Phone #: jon wled sapu   S Name/Age/Gender Ronald Brooks 43 y.o. male Room/Bed: ZTI45/YKD98  Code Status   Code Status: Full Code  Home/SNF/Other Home Patient oriented to: self, place, time and situation Is this baseline? Yes   Triage Complete: Triage complete  Chief Complaint SUICIDAL  Triage Note Patient complaining of wanting to cut himself so he will die. Patient states he has been off his medications for 2 months.   Allergies Allergies  Allergen Reactions  . Other Shortness Of Breath    "dander"  . Vitamin E Rash    Level of Care/Admitting Diagnosis ED Disposition    None      B Medical/Surgery History Past Medical History:  Diagnosis Date  . ADHD   . Asthma   . Bipolar 1 disorder ([facility])    History reviewed. No pertinent surgical history.   A IV Location/Drains/Wounds Patient Lines/Drains/Airways Status   Active Line/Drains/Airways    None          Intake/Output Last 24 hours No intake or output data in the 24 hours ending 09/14/19 2353  Labs/Imaging Results for orders placed or performed during the hospital encounter of 09/14/19 (from the past 48 hour(s))  Comprehensive metabolic panel     Status: Abnormal   Collection Time: 09/14/19  1:35 AM  Result Value Ref Range   Sodium 137 135 - 145 mmol/L   Potassium 3.7 3.5 - 5.1 mmol/L   Chloride 104 98 - 111 mmol/L   CO2 20 (L) 22 - 32 mmol/L   Glucose, Bld 161 (H) 70 - 99 mg/dL   BUN 16 6 - 20 mg/dL   Creatinine, Ser 3.38 0.61 - 1.24 mg/dL   Calcium 9.3 8.9 - 25.0 mg/dL   Total Protein 7.6 6.5 - 8.1 g/dL   Albumin 4.2 3.5 - 5.0 g/dL   AST 24 15 - 41 U/L   ALT 19 0 - 44 U/L   Alkaline Phosphatase 48 38 - 126 U/L   Total Bilirubin 0.7 0.3 - 1.2 mg/dL   GFR calc non Af Amer >60 >60 mL/min   GFR calc Af Amer >60 >60 mL/min   Anion gap 13 5 - 15    Comment: Performed at [facility], [address] [place],  [place] [address]  Ethanol     Status: None   Collection Time: 09/14/19  1:35 AM  Result Value Ref Range   Alcohol, Ethyl (B) <10 <10 mg/dL    Comment: (NOTE) Lowest detectable limit for serum alcohol is 10 mg/dL. For medical purposes only. Performed at [facility], [address], [place], [place] [address]   Salicylate level     Status: None   Collection Time: 09/14/19  1:35 AM  Result Value Ref Range   Salicylate Lvl <7.0 2.8 - 30.0 mg/dL    Comment: Performed at [facility], [address], [place], [place] [address]  Acetaminophen level     Status: Abnormal   Collection Time: 09/14/19  1:35 AM  Result Value Ref Range   Acetaminophen (Tylenol), Serum <10 (L) 10 - 30 ug/mL    Comment: (NOTE) Therapeutic concentrations vary significantly. A range of 10-30 ug/mL  may be an effective concentration for many patients. However, some  are best treated at concentrations outside of this range. Acetaminophen concentrations >150 ug/mL at 4 hours after ingestion  and >50 ug/mL at 12 hours after ingestion are  often associated with  toxic reactions. Performed at [facility], [place] [address], [place], [place] [address]   cbc     Status: Abnormal   Collection Time: 09/14/19  1:35 AM  Result Value Ref Range   WBC 17.3 (H) 4.0 - 10.5 K/uL   RBC 4.97 4.22 - 5.81 MIL/uL   Hemoglobin 14.7 13.0 - 17.0 g/dL   HCT 44.9 39.0 - 52.0 %   MCV 90.3 80.0 - 100.0 fL   MCH 29.6 26.0 - 34.0 pg   MCHC 32.7 30.0 - 36.0 g/dL   RDW 12.8 11.5 - 15.5 %   Platelets 409 (H) 150 - 400 K/uL   nRBC 0.0 0.0 - 0.2 %    Comment: Performed at [facility], [place] [address] [place], [place] [address]  Rapid urine drug screen (hospital performed)     Status: Abnormal   Collection Time: 09/14/19  1:35 AM  Result Value Ref Range   Opiates NONE DETECTED NONE DETECTED   Cocaine POSITIVE (A) NONE DETECTED   Benzodiazepines NONE DETECTED NONE DETECTED    Amphetamines NONE DETECTED NONE DETECTED   Tetrahydrocannabinol NONE DETECTED NONE DETECTED   Barbiturates NONE DETECTED NONE DETECTED    Comment: (NOTE) DRUG SCREEN FOR MEDICAL PURPOSES ONLY.  IF CONFIRMATION IS NEEDED FOR ANY PURPOSE, NOTIFY LAB WITHIN 5 DAYS. LOWEST DETECTABLE LIMITS FOR URINE DRUG SCREEN Drug Class                     Cutoff (ng/mL) Amphetamine and metabolites    1000 Barbiturate and metabolites    200 Benzodiazepine                 295 Tricyclics and metabolites     300 Opiates and metabolites        300 Cocaine and metabolites        300 THC                            50 Performed at [facility], [place] [address], [place], [place] [address]   SARS CORONAVIRUS 2 (TAT 6-24 HRS) Nasopharyngeal Nasopharyngeal Swab     Status: None   Collection Time: 09/14/19  4:08 AM   Specimen: Nasopharyngeal Swab  Result Value Ref Range   SARS Coronavirus 2 NEGATIVE NEGATIVE    Comment: (NOTE) SARS-CoV-2 target nucleic acids are NOT DETECTED. The SARS-CoV-2 RNA is generally detectable in upper and lower respiratory specimens during the acute phase of infection. Negative results do not preclude SARS-CoV-2 infection, do not rule out co-infections with other pathogens, and should not be used as the sole basis for treatment or other patient management decisions. Negative results must be combined with clinical observations, patient history, and epidemiological information. The expected result is Negative. Fact Sheet for Patients: SugarRoll.be Fact Sheet for Healthcare Providers: https://www.woods-mathews.com/ This test is not yet approved or cleared by the [place] FDA and  has been authorized for detection and/or diagnosis of SARS-CoV-2 by FDA under an Emergency Use Authorization (EUA). This EUA will remain  in effect (meaning this test can be used) for the duration of the COVID-19 declaration under Section  56 4(b)(1) of the Act, 21 U.S.C. section 360bbb-3(b)(1), unless the authorization is terminated or revoked sooner. Performed at [facility], [place] [address] [place], [place] [address]    No results found.  Pending Labs [employer] (From admission, onward)  None      Vitals/Pain Today's Vitals   09/14/19 1444 09/14/19 1923 09/14/19 2307 09/14/19 2332  BP: 114/74 98/76  99/71  Pulse: 70 80  89  Resp: 16 16  16   Temp: 98.3 F (36.8 C) 98.4 F (36.9 C) 98.3 F (36.8 C) 98.2 F (36.8 C)  TempSrc: Oral Oral Oral Oral  SpO2: 95% 93%  94%  PainSc:        Isolation Precautions No active isolations  Medications Medications  divalproex (DEPAKOTE) DR tablet 500 mg (0 mg Oral Hold 09/14/19 2127)  Melatonin TABS 3 mg (has no administration in time range)  acetaminophen (TYLENOL) tablet 650 mg (has no administration in time range)  ondansetron (ZOFRAN) tablet 4 mg (has no administration in time range)  alum & mag hydroxide-simeth (MAALOX/MYLANTA) 200-200-20 MG/5ML suspension 30 mL (has no administration in time range)  divalproex (DEPAKOTE) DR tablet 500 mg (500 mg Oral Given 09/14/19 2127)  OLANZapine (ZYPREXA) tablet 10 mg (10 mg Oral Given 09/14/19 2127)    Mobility walks Low fall risk   Focused Assessments   R Recommendations: See Admitting Provider Note  Report given to:   Additional Notes: psych

## 2019-09-14 NOTE — ED Triage Notes (Signed)
Patient complaining of wanting to cut himself so he will die. Patient states he has been off his medications for 2 months.

## 2019-09-15 ENCOUNTER — Inpatient Hospital Stay (HOSPITAL_COMMUNITY)
Admission: AD | Admit: 2019-09-15 | Discharge: 2019-09-22 | DRG: 897 | Disposition: A | Discharge status: Home / Self Care | Payer: No Typology Code available for payment source | Source: Intra-hospital | Attending: Psychiatry | Admitting: Psychiatry

## 2019-09-15 ENCOUNTER — Encounter (HOSPITAL_COMMUNITY): Payer: Self-pay

## 2019-09-15 ENCOUNTER — Other Ambulatory Visit: Payer: Self-pay

## 2019-09-15 DIAGNOSIS — F1424 Cocaine dependence with cocaine-induced mood disorder: Secondary | ICD-10-CM | POA: Diagnosis not present

## 2019-09-15 DIAGNOSIS — F602 Antisocial personality disorder: Secondary | ICD-10-CM | POA: Diagnosis present

## 2019-09-15 DIAGNOSIS — R45851 Suicidal ideations: Secondary | ICD-10-CM | POA: Diagnosis present

## 2019-09-15 DIAGNOSIS — F4310 Post-traumatic stress disorder, unspecified: Secondary | ICD-10-CM | POA: Diagnosis present

## 2019-09-15 DIAGNOSIS — F1024 Alcohol dependence with alcohol-induced mood disorder: Secondary | ICD-10-CM

## 2019-09-15 DIAGNOSIS — K219 Gastro-esophageal reflux disease without esophagitis: Secondary | ICD-10-CM | POA: Diagnosis present

## 2019-09-15 DIAGNOSIS — Z79899 Other long term (current) drug therapy: Secondary | ICD-10-CM | POA: Diagnosis not present

## 2019-09-15 DIAGNOSIS — F1420 Cocaine dependence, uncomplicated: Secondary | ICD-10-CM

## 2019-09-15 DIAGNOSIS — F1994 Other psychoactive substance use, unspecified with psychoactive substance-induced mood disorder: Secondary | ICD-10-CM | POA: Diagnosis not present

## 2019-09-15 DIAGNOSIS — G4700 Insomnia, unspecified: Secondary | ICD-10-CM | POA: Diagnosis present

## 2019-09-15 DIAGNOSIS — J45909 Unspecified asthma, uncomplicated: Secondary | ICD-10-CM | POA: Diagnosis present

## 2019-09-15 DIAGNOSIS — Z20828 Contact with and (suspected) exposure to other viral communicable diseases: Secondary | ICD-10-CM | POA: Diagnosis present

## 2019-09-15 DIAGNOSIS — F17210 Nicotine dependence, cigarettes, uncomplicated: Secondary | ICD-10-CM | POA: Diagnosis present

## 2019-09-15 DIAGNOSIS — F319 Bipolar disorder, unspecified: Secondary | ICD-10-CM | POA: Diagnosis present

## 2019-09-15 DIAGNOSIS — F1020 Alcohol dependence, uncomplicated: Secondary | ICD-10-CM | POA: Diagnosis present

## 2019-09-15 MED ORDER — OLANZAPINE 10 MG PO TBDP
10.0000 mg | ORAL_TABLET | Freq: Three times a day (TID) | ORAL | Status: DC | PRN
  Administered 2019-09-15 – 2019-09-19 (×2): 10 mg via ORAL
  Filled 2019-09-15 (×2): qty 1

## 2019-09-15 MED ORDER — LORAZEPAM 1 MG PO TABS
1.0000 mg | ORAL_TABLET | ORAL | Status: AC | PRN
  Administered 2019-09-15: 1 mg via ORAL

## 2019-09-15 MED ORDER — ALUM & MAG HYDROXIDE-SIMETH 200-200-20 MG/5ML PO SUSP: 30.0000 mL | ORAL | Status: DC | PRN

## 2019-09-15 MED ORDER — MELATONIN 3 MG PO TABS
3.0000 mg | ORAL_TABLET | Freq: Every evening | ORAL | Status: DC | PRN
  Filled 2019-09-15 (×2): qty 1

## 2019-09-15 MED ORDER — VITAMIN B-1 100 MG PO TABS
100.0000 mg | ORAL_TABLET | Freq: Every day | ORAL | Status: DC
  Administered 2019-09-16 – 2019-09-21 (×4): 100 mg via ORAL
  Filled 2019-09-15 (×10): qty 1

## 2019-09-15 MED ORDER — ACETAMINOPHEN 325 MG PO TABS: 650.0000 mg | ORAL_TABLET | Freq: Four times a day (QID) | ORAL | Status: DC | PRN

## 2019-09-15 MED ORDER — ALBUTEROL SULFATE HFA 108 (90 BASE) MCG/ACT IN AERS: 1.0000 | INHALATION_SPRAY | Freq: Four times a day (QID) | RESPIRATORY_TRACT | Status: DC | PRN

## 2019-09-15 MED ORDER — ZIPRASIDONE MESYLATE 20 MG IM SOLR: 20.0000 mg | INTRAMUSCULAR | Status: DC | PRN

## 2019-09-15 MED ORDER — FOLIC ACID 1 MG PO TABS
1.0000 mg | ORAL_TABLET | Freq: Every day | ORAL | Status: DC
  Administered 2019-09-16 – 2019-09-21 (×4): 1 mg via ORAL
  Filled 2019-09-15 (×10): qty 1

## 2019-09-15 MED ORDER — OLANZAPINE 10 MG PO TABS
10.0000 mg | ORAL_TABLET | Freq: Two times a day (BID) | ORAL | Status: DC
  Administered 2019-09-15 – 2019-09-17 (×4): 10 mg via ORAL
  Filled 2019-09-15 (×7): qty 1

## 2019-09-15 MED ORDER — MAGNESIUM HYDROXIDE 400 MG/5ML PO SUSP: 30.0000 mL | Freq: Every day | ORAL | Status: DC | PRN

## 2019-09-15 MED ORDER — DIVALPROEX SODIUM 500 MG PO DR TAB
500.0000 mg | DELAYED_RELEASE_TABLET | Freq: Two times a day (BID) | ORAL | Status: DC
  Administered 2019-09-15 – 2019-09-21 (×11): 500 mg via ORAL
  Filled 2019-09-15 (×16): qty 1

## 2019-09-15 MED ORDER — PANTOPRAZOLE SODIUM 40 MG PO TBEC
40.0000 mg | DELAYED_RELEASE_TABLET | Freq: Every day | ORAL | Status: DC
  Administered 2019-09-16 – 2019-09-21 (×4): 40 mg via ORAL
  Filled 2019-09-15 (×11): qty 1

## 2019-09-15 MED ORDER — LORAZEPAM 1 MG PO TABS
1.0000 mg | ORAL_TABLET | Freq: Four times a day (QID) | ORAL | Status: DC | PRN
  Administered 2019-09-16 – 2019-09-20 (×4): 1 mg via ORAL
  Filled 2019-09-15 (×6): qty 1

## 2019-09-15 MED ORDER — TRAZODONE HCL 50 MG PO TABS
50.0000 mg | ORAL_TABLET | Freq: Every evening | ORAL | Status: DC | PRN
  Administered 2019-09-16: 50 mg via ORAL
  Filled 2019-09-15: qty 1

## 2019-09-15 NOTE — Progress Notes (Signed)
EKG results placed on the outside of shadow chart   Normal Sinus rhythm  Normal ECG   QT/QTc   370/464 ms

## 2019-09-15 NOTE — Progress Notes (Signed)
[facility] Group Notes:  (Nursing/MHT/Case Management/Adjunct)  Date:  09/15/2019  Time:  1:24 PM  Type of Therapy:  Nurse Education  Participation Level:  Did Not Attend. Pt asleep.    Sybella Harnish L 09/15/2019, 1:24 PM 

## 2019-09-15 NOTE — Progress Notes (Signed)

## 2019-09-15 NOTE — Progress Notes (Signed)
Psychoeducational Group Note  Date:  09/15/2019 Time:  2030 Group Topic/Focus: Wrap up group  Participation Level: Did Not Attend  Participation Quality:  Not Applicable  Affect:  Not Applicable  Cognitive:  Not Applicable  Insight:  Not Applicable  Engagement in Group: Not Applicable  Additional Comments: Pt was sleeping during group time.  Shellia Cleverly 09/15/2019, 9:45 PM

## 2019-09-15 NOTE — Progress Notes (Signed)
Ronald Brooks is a 43 y.o. male Voluntary admitted for suicide ideations with a plan to cut self with a knife. Pt stated he has been out of his medications for the past two months. Pt stated he is on Depakote and Zyprexa. Pt is alert and oriented. Pt has been very irritable and restless, verbally a abusive towards staff and very uncooperative  With the admission process. Pt denied SI/HI during admission. Consents signed, skin/belongings search completed and pt oriented to unit. Pt stable at this time. Pt given the opportunity to express concerns and ask questions. Pt given toiletries. Will continue to monitor.

## 2019-09-15 NOTE — H&P (Signed)
Psychiatric Admission Assessment Adult  Patient Identification: Ronald Brooks MRN:  [id number] Date of Evaluation:  09/15/2019 Chief Complaint:  Bipolar 1 Principal Diagnosis: <principal problem not specified> Diagnosis:  Active Problems:   Bipolar 1 disorder ([place])  History of Present Illness: Patient is seen and examined.  Patient is a 43 year old male with a past psychiatric history significant for cocaine dependence, reported bipolar disorder, reported posttraumatic stress disorder who presented to the [facility] emergency department on 09/14/2019 for voluntary evaluation.  The patient stated at that time he had been feeling suicidal for the last few days.  He thought about taking a knife and cutting himself so he could "get some peace".  The history of his treatment and running out of medications over the last 2 months is confusing and he is not a great historian.  Initially he stated that he had been at [facility] approximately 1-1/2 months ago.  He stated that after he got out of [facility] he was sent to [facility] to [place].  He stayed there for 9 days, and then went to an [facility] in [place].  Then somehow or another got to the [facility]  He stated he hoped he would be able to return to the [facility]  He stated that he called them last night in an attempt to begin arrangements to return there.  He contends that he has been sober over the entire period of time until last night.  He is significantly agitated, irritable.  He denied any alcohol, he denied any benzodiazepines.  He stated he was previously treated with Depakote as well as Zyprexa.  He also stated he had a history of asthma.  Review of the electronic medical record revealed that he was seen in the [facility] emergency department on 7/27, and it did not appear as though he was admitted but maintained there in some observation status.  He was excepted to [place].  His diagnosis there  was substance-induced mood disorder, cocaine use disorder, alcohol use disorder and a history of antisocial personality disorder which apparently was in the chart prior to this emergency room visit.  The reason for his evaluation there was that he was picked up by police in [place] and became suicidal secondary to getting kicked out of his girlfriend's [facility] 2 days prior to that admission.  Patient had apparently been in that emergency room 5 times fairly recently, and had been admitted for crisis stabilization through [facility].  He also had been previously hospitalized at [facility], had also received substance abuse treatment at [facility].  He was seen also in the [facility] emergency department on 09/03/2018.  He was kept there for approximately 2 days.  It stated that he had received the Depakote prescription and the olanzapine prescription from [facility].  At that time he stated he was staying in a facility for alcohol and drug use, but got angry and was not allowed to talk to male resident so he left on that date.  His dosage of medications included olanzapine 10 mg p.o. twice daily and Depakote DR 500 mg twice daily.  He was admitted to the hospital for evaluation and stabilization.  Associated Signs/Symptoms: Depression Symptoms:  depressed mood, anhedonia, insomnia, psychomotor agitation, fatigue, feelings of worthlessness/guilt, difficulty concentrating, hopelessness, suicidal thoughts with specific plan, anxiety, panic attacks, loss of energy/fatigue, disturbed sleep, (Hypo) Manic Symptoms:  Impulsivity, Irritable Mood, Labiality of Mood, Anxiety Symptoms:  Excessive Worry,  Psychotic Symptoms:  Denied PTSD Symptoms: Had a traumatic exposure:  In the past Total Time spent with patient: 45 minutes  Past Psychiatric History: The patient was really unwilling to describe his psychiatric history except over the last several months, and that was because we  had electronic medical records.  He was recently hospitalized at [facility].  Does not appear as though he was psychiatrically admitted, it looks like he was held in the psychiatric observation unit and then sent to [facility].  He then went to an [facility], but left there and was at the [facility].  The notes in the chart state that he has a history of polysubstance disorders, antisocial personality disorder, substance-induced mood disorder, and the patient reports history of bipolar disorder.  He recently received a prescription for Depakote as well as Zyprexa from [facility] as an outpatient.  Is the patient at risk to self? Yes.    Has the patient been a risk to self in the past 6 months? Yes.    Has the patient been a risk to self within the distant past? Yes.    Is the patient a risk to others? No.  Has the patient been a risk to others in the past 6 months? No.  Has the patient been a risk to others within the distant past? No.   Prior Inpatient Therapy:   Prior Outpatient Therapy:    Alcohol Screening: 1. How often do you have a drink containing alcohol?: 2 to 4 times a month 2. How many drinks containing alcohol do you have on a typical day when you are drinking?: 5 or 6 3. How often do you have six or more drinks on one occasion?: Monthly AUDIT-C Score: 6 4. How often during the last year have you found that you were not able to stop drinking once you had started?: Less than monthly 5. How often during the last year have you failed to do what was normally expected from you becasue of drinking?: Less than monthly 6. How often during the last year have you needed a first drink in the morning to get yourself going after a heavy drinking session?: Less than monthly 7. How often during the last year have you had a feeling of guilt of remorse after drinking?: Never 8. How often during the last year have you been unable to remember what happened the night before because  you had been drinking?: Never 9. Have you or someone else been injured as a result of your drinking?: No 10. Has a relative or friend or a doctor or another health worker been concerned about your drinking or suggested you cut down?: No Alcohol Use Disorder Identification Test Final Score (AUDIT): 9 Alcohol Brief Interventions/Follow-up: Patient Refused Substance Abuse History in the last 12 months:  Yes.   Consequences of Substance Abuse: Withdrawal Symptoms:   Cramps Diaphoresis Diarrhea Headaches Nausea Tremors Vomiting Previous Psychotropic Medications: Yes  Psychological Evaluations: Yes  Past Medical History:  Past Medical History:  Diagnosis Date  . ADHD   . Asthma   . Bipolar 1 disorder (HCC)    History reviewed. No pertinent surgical history. Family History: History reviewed. No pertinent family history. Family Psychiatric  History: Noncontributory Tobacco Screening: Have you used any form of tobacco in the last 30 days? (Cigarettes, Smokeless Tobacco, Cigars, and/or Pipes): Yes Tobacco use, Select all that apply: 5 or more cigarettes per day Counseled patient on smoking cessation including recognizing danger situations, developing  coping skills and basic information about quitting provided: Refused/Declined practical counseling Social History:  Social History   Substance and Sexual Activity  Alcohol Use Yes   Comment: occasional      Social History   Substance and Sexual Activity  Drug Use No   Comment: has not used cocain and ETOH for 29 days    Additional Social History:      Pain Medications: see MAR Prescriptions: See MAR Over the Counter: See MAR History of alcohol / drug use?: Yes Negative Consequences of Use: Financial, Work / Programmer, multimedia, Personal relationships Withdrawal Symptoms: Agitation, Irritability                    Allergies:   Allergies  Allergen Reactions  . Other Shortness Of Breath    "dander"  . Vitamin E Rash   Lab  Results:  Results for orders placed or performed during the hospital encounter of 09/14/19 (from the past 48 hour(s))  Comprehensive metabolic panel     Status: Abnormal   Collection Time: 09/14/19  1:35 AM  Result Value Ref Range   Sodium 137 135 - 145 mmol/L   Potassium 3.7 3.5 - 5.1 mmol/L   Chloride 104 98 - 111 mmol/L   CO2 20 (L) 22 - 32 mmol/L   Glucose, Bld 161 (H) 70 - 99 mg/dL   BUN 16 6 - 20 mg/dL   Creatinine, Ser 0.45 0.61 - 1.24 mg/dL   Calcium 9.3 8.9 - 40.9 mg/dL   Total Protein 7.6 6.5 - 8.1 g/dL   Albumin 4.2 3.5 - 5.0 g/dL   AST 24 15 - 41 U/L   ALT 19 0 - 44 U/L   Alkaline Phosphatase 48 38 - 126 U/L   Total Bilirubin 0.7 0.3 - 1.2 mg/dL   GFR calc non Af Amer >60 >60 mL/min   GFR calc Af Amer >60 >60 mL/min   Anion gap 13 5 - 15    Comment: Performed at [facility], [address], [place], [place] [address]  Ethanol     Status: None   Collection Time: 09/14/19  1:35 AM  Result Value Ref Range   Alcohol, Ethyl (B) <10 <10 mg/dL    Comment: (NOTE) Lowest detectable limit for serum alcohol is 10 mg/dL. For medical purposes only. Performed at [facility], [address], [place], [place] [address]   Salicylate level     Status: None   Collection Time: 09/14/19  1:35 AM  Result Value Ref Range   Salicylate Lvl <7.0 2.8 - 30.0 mg/dL    Comment: Performed at [facility], [address], [place], [place] [address]  Acetaminophen level     Status: Abnormal   Collection Time: 09/14/19  1:35 AM  Result Value Ref Range   Acetaminophen (Tylenol), Serum <10 (L) 10 - 30 ug/mL    Comment: (NOTE) Therapeutic concentrations vary significantly. A range of 10-30 ug/mL  may be an effective concentration for many patients. However, some  are best treated at concentrations outside of this range. Acetaminophen concentrations >150 ug/mL at 4 hours after ingestion  and >50 ug/mL at 12 hours after ingestion are  often associated with  toxic reactions. Performed at [facility], [address], [place], [place] [address]   cbc     Status: Abnormal   Collection Time: 09/14/19  1:35 AM  Result Value Ref Range   WBC 17.3 (H) 4.0 - 10.5 K/uL  RBC 4.97 4.22 - 5.81 MIL/uL   Hemoglobin 14.7 13.0 - 17.0 g/dL   HCT 16.1 09.6 - 04.5 %   MCV 90.3 80.0 - 100.0 fL   MCH 29.6 26.0 - 34.0 pg   MCHC 32.7 30.0 - 36.0 g/dL   RDW 40.9 81.1 - 91.4 %   Platelets 409 (H) 150 - 400 K/uL   nRBC 0.0 0.0 - 0.2 %    Comment: Performed at [facility], [address] [place], [place] [address]  Rapid urine drug screen (hospital performed)     Status: Abnormal   Collection Time: 09/14/19  1:35 AM  Result Value Ref Range   Opiates NONE DETECTED NONE DETECTED   Cocaine POSITIVE (A) NONE DETECTED   Benzodiazepines NONE DETECTED NONE DETECTED   Amphetamines NONE DETECTED NONE DETECTED   Tetrahydrocannabinol NONE DETECTED NONE DETECTED   Barbiturates NONE DETECTED NONE DETECTED    Comment: (NOTE) DRUG SCREEN FOR MEDICAL PURPOSES ONLY.  IF CONFIRMATION IS NEEDED FOR ANY PURPOSE, NOTIFY LAB WITHIN 5 DAYS. LOWEST DETECTABLE LIMITS FOR URINE DRUG SCREEN Drug Class                     Cutoff (ng/mL) Amphetamine and metabolites    1000 Barbiturate and metabolites    200 Benzodiazepine                 200 Tricyclics and metabolites     300 Opiates and metabolites        300 Cocaine and metabolites        300 THC                            50 Performed at [facility], [address] [place], [place] [address]   SARS CORONAVIRUS 2 (TAT 6-24 HRS) Nasopharyngeal Nasopharyngeal Swab     Status: None   Collection Time: 09/14/19  4:08 AM   Specimen: Nasopharyngeal Swab  Result Value Ref Range   SARS Coronavirus 2 NEGATIVE NEGATIVE    Comment: (NOTE) SARS-CoV-2 target nucleic acids are NOT DETECTED. The SARS-CoV-2 RNA is generally detectable in upper and  lower respiratory specimens during the acute phase of infection. Negative results do not preclude SARS-CoV-2 infection, do not rule out co-infections with other pathogens, and should not be used as the sole basis for treatment or other patient management decisions. Negative results must be combined with clinical observations, patient history, and epidemiological information. The expected result is Negative. Fact Sheet for Patients: HairSlick.no Fact Sheet for Healthcare Providers: quierodirigir.com This test is not yet approved or cleared by the [place] FDA and  has been authorized for detection and/or diagnosis of SARS-CoV-2 by FDA under an Emergency Use Authorization (EUA). This EUA will remain  in effect (meaning this test can be used) for the duration of the COVID-19 declaration under Section 56 4(b)(1) of the Act, 21 U.S.C. section 360bbb-3(b)(1), unless the authorization is terminated or revoked sooner. Performed at [facility], [address] [place], [place] [address]     Blood Alcohol level:  Lab Results  Component Value Date   ETH <10 09/14/2019   ETH <10 09/03/2018    Metabolic Disorder Labs:  No results found for: HGBA1C, MPG No results found for: PROLACTIN No results found for: CHOL, TRIG, HDL, CHOLHDL, VLDL, LDLCALC  Current Medications: Current Facility-Administered Medications  Medication Dose Route Frequency Provider Last Rate Last Dose  .  acetaminophen (TYLENOL) tablet 650 mg  650 mg Oral Q6H PRN Patrcia Dolly, FNP      . albuterol (VENTOLIN HFA) 108 (90 Base) MCG/ACT inhaler 1-2 puff  1-2 puff Inhalation Q6H PRN Patrcia Dolly, FNP      . alum & mag hydroxide-simeth (MAALOX/MYLANTA) 200-200-20 MG/5ML suspension 30 mL  30 mL Oral Q4H PRN Patrcia Dolly, FNP      . divalproex (DEPAKOTE) DR tablet 500 mg  500 mg Oral Q12H Patrcia Dolly, FNP   500 mg at 09/15/19 0809  . folic acid (FOLVITE) tablet  1 mg  1 mg Oral Daily Antonieta Pert, MD      . LORazepam (ATIVAN) tablet 1 mg  1 mg Oral Q6H PRN Antonieta Pert, MD      . OLANZapine zydis (ZYPREXA) disintegrating tablet 10 mg  10 mg Oral Q8H PRN Antonieta Pert, MD       And  . LORazepam (ATIVAN) tablet 1 mg  1 mg Oral PRN Antonieta Pert, MD       And  . ziprasidone (GEODON) injection 20 mg  20 mg Intramuscular PRN Antonieta Pert, MD      . magnesium hydroxide (MILK OF MAGNESIA) suspension 30 mL  30 mL Oral Daily PRN Patrcia Dolly, FNP      . Melatonin TABS 3 mg  3 mg Oral QHS PRN Patrcia Dolly, FNP      . OLANZapine (ZYPREXA) tablet 10 mg  10 mg Oral BID Patrcia Dolly, FNP   10 mg at 09/15/19 0809  . pantoprazole (PROTONIX) EC tablet 40 mg  40 mg Oral Daily Antonieta Pert, MD      . thiamine (VITAMIN B-1) tablet 100 mg  100 mg Oral Daily Antonieta Pert, MD      . traZODone (DESYREL) tablet 50 mg  50 mg Oral QHS PRN Patrcia Dolly, FNP       PTA Medications: Medications Prior to Admission  Medication Sig Dispense Refill Last Dose  . albuterol (VENTOLIN HFA) 108 (90 Base) MCG/ACT inhaler Inhale 1-2 puffs into the lungs every 6 (six) hours as needed for wheezing or shortness of breath.     . divalproex (DEPAKOTE) 500 MG DR tablet Take 1 tablet (500 mg total) by mouth every 12 (twelve) hours. 10 tablet 0   . OLANZapine (ZYPREXA) 10 MG tablet Take 1 tablet (10 mg total) by mouth 2 (two) times daily. 10 tablet 0     Musculoskeletal: Strength & Muscle Tone: within normal limits Gait & Station: normal Patient leans: N/A  Psychiatric Specialty Exam: Physical Exam  Nursing note and vitals reviewed. Constitutional: He is oriented to person, place, and time. He appears well-developed and well-nourished.  HENT:  Head: Normocephalic and atraumatic.  Respiratory: Effort normal.  Neurological: He is alert and oriented to person, place, and time.    ROS  Blood pressure 93/76, pulse (!) 127, temperature 97.8 F (36.6  C), temperature source Oral, resp. rate 20, height  (1.778 m), weight 86.6 kg, SpO2 100 %.Body mass index is 27.41 kg/m.  General Appearance: Disheveled  Eye Contact:  Minimal  Speech:  Normal Rate  Volume:  Increased  Mood:  Irritable  Affect:  Congruent  Thought Process:  Coherent and Descriptions of Associations: Circumstantial  Orientation:  Full (Time, Place, and Person)  Thought Content:  Rumination  Suicidal Thoughts:  Yes.  without intent/plan  Homicidal Thoughts:  No  Memory:  Immediate;   Poor Recent;   Poor Remote;   Poor  Judgement:  Impaired  Insight:  Lacking  Psychomotor Activity:  Increased  Concentration:  Concentration: Poor and Attention Span: Poor  Recall:  Poor  Fund of Knowledge:  Fair  Language:  Fair  Akathisia:  Negative  Handed:  Right  AIMS (if indicated):     Assets:  Desire for Improvement Resilience  ADL's:  Intact  Cognition:  WNL  Sleep:  Number of Hours: 3.75    Treatment Plan Summary: Daily contact with patient to assess and evaluate symptoms and progress in treatment, Medication management and Plan : Patient is seen and examined.  Patient is a 43 year old male with the above-stated past psychiatric history who was admitted secondary to suicidal ideation.  He will be admitted to the hospital.  He will be integrated into the milieu.  He will be encouraged to attend groups.  He has already been started on Depakote and olanzapine at their previous dosages.  He also has received melatonin.  He has a reported history of asthma and does have an albuterol inhaler on board if necessary.  Review of his laboratories revealed a significantly elevated glucose at 161.  His liver function enzymes were normal.  The rest of his electrolytes were normal.  His CBC showed a significant elevation of his white blood cell count at 17.3.  His platelets were elevated at 409.  Acetaminophen and salicylate were negative.  His blood alcohol was less than 10.  His drug  screen was positive for cocaine.  He is significantly psychomotor agitated, and although he said he was sober for 5 months, and had not been using alcohol I am a bit concerned about alcohol withdrawal given the agitation.  I am going to place an Ativan 1 mg p.o. every 6 hours as needed a CIWA greater than 10.  I will also start folic acid and thiamine.  It does not appear that he had a hemoglobin A1c ordered, and I was unable to find any history of diabetes in his previous visits in the emergency department.  He also did not have a TSH done, and I will order that for completeness given his agitation.  He is tachycardic with a rate of 127, but his blood pressure is stable at 93/76.  He slept only 3.75 hours last night, and his CIWA at 2 AM was approximately 12, substantiating my concern for alcohol withdrawal.  Observation Level/Precautions:  Detox 15 minute checks  Laboratory:  Chemistry Profile  Psychotherapy:    Medications:    Consultations:    Discharge Concerns:    Estimated LOS:  Other:     Physician Treatment Plan for Primary Diagnosis: <principal problem not specified> Long Term Goal(s): Improvement in symptoms so as ready for discharge  Short Term Goals: Ability to identify changes in lifestyle to reduce recurrence of condition will improve, Ability to verbalize feelings will improve, Ability to disclose and discuss suicidal ideas, Ability to demonstrate self-control will improve, Ability to identify and develop effective coping behaviors will improve, Ability to maintain clinical measurements within normal limits will improve, Compliance with prescribed medications will improve and Ability to identify triggers associated with substance abuse/mental health issues will improve  Physician Treatment Plan for Secondary Diagnosis: Active Problems:   Bipolar 1 disorder (HCC)  Long Term Goal(s): Improvement in symptoms so as ready for discharge  Short Term Goals: Ability to identify changes  in lifestyle to reduce recurrence of condition will improve, Ability  to verbalize feelings will improve, Ability to disclose and discuss suicidal ideas, Ability to demonstrate self-control will improve, Ability to identify and develop effective coping behaviors will improve, Ability to maintain clinical measurements within normal limits will improve, Compliance with prescribed medications will improve and Ability to identify triggers associated with substance abuse/mental health issues will improve  I certify that inpatient services furnished can reasonably be expected to improve the patient's condition.    Antonieta Pert, MD 11/7/202012:18 PM
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
## 2019-09-15 NOTE — Progress Notes (Signed)
D. Pt remained in bed sleeping for much of the am- guarded and mildly irritable during interactions.  Pt currently denies SI/HI and AVH  A. Labs and vitals monitored. Pt given and educated on medications. Pt supported emotionally and encouraged to express concerns and ask questions.   R. Pt remains safe with 15 minute checks. Will continue POC.

## 2019-09-15 NOTE — BHH Group Notes (Signed)
LCSW Group Therapy Note  09/15/2019    10:00-11:00am   Type of Therapy and Topic:  Group Therapy: Early Messages Received About Anger  Participation Level:  Did Not Attend   Description of Group:   In this group, patients shared and discussed the early messages received in their lives about anger through parental or other adult modeling, teaching, repression, punishment, violence, and more.  Participants identified how those childhood lessons influence even now how they usually or often react when angered.  The group discussed that anger is a secondary emotion and what may be the underlying emotional themes that come out through anger outbursts or that are ignored through anger suppression.  Finally, as a group there was a conversation about the workbook's quote that "There is nothing wrong with anger; it is just a sign something needs to change."     Therapeutic Goals: 1. Patients will identify one or more childhood message about anger that they received and how it was taught to them. 2. Patients will discuss how these childhood experiences have influenced and continue to influence their own expression or repression of anger even today. 3. Patients will explore possible primary emotions that tend to fuel their secondary emotion of anger. 4. Patients will learn that anger itself is normal and cannot be eliminated, and that healthier coping skills can assist with resolving conflict rather than worsening situations.  Summary of Patient Progress:  The patient was invited to group but chose not to attend.  Therapeutic Modalities:   Cognitive Behavioral Therapy Motivation Interviewing  Jerek Meulemans J Grossman-Orr  .  

## 2019-09-15 NOTE — Tx Team (Signed)
Initial Treatment Plan 09/15/2019 3:14 AM [name] Catholic DPO:[id number]    PATIENT STRESSORS: Medication change or noncompliance Occupational concerns Substance abuse   PATIENT STRENGTHS: Work skills   PATIENT IDENTIFIED PROBLEMS: depression  anxiety  "Be back on medications"  'Ronald Brooks"               DISCHARGE CRITERIA:  Ability to meet basic life and health needs Adequate post-discharge living arrangements Improved stabilization in mood, thinking, and/or behavior Medical problems require only outpatient monitoring Motivation to continue treatment in a less acute level of care  PRELIMINARY DISCHARGE PLAN: Attend aftercare/continuing care group Attend PHP/IOP Attend 12-step recovery group Outpatient therapy Placement in alternative living arrangements  PATIENT/FAMILY INVOLVEMENT: This treatment plan has been presented to and reviewed with the patient, Ronald Brooks, and/or family member.  The patient and family have been given the opportunity to ask questions and make suggestions.  Wolfgang Phoenix, RN 09/15/2019, 3:14 AM

## 2019-09-16 NOTE — Progress Notes (Signed)
[facility]  09/16/2019 11:07 AM Ronald Brooks  MRN:  [id number]  Subjective: [name] reports, "I think I'm doing better, it's just I keep having the suicidal thoughts off & on. My depression is still up there at #8. No voices. I have not been able to attend group sessions yet, not feeling like going as of yet".  Objective: Patient is a [age] year old male with a past psychiatric history significant for cocaine dependence, reported bipolar disorder, reported posttraumatic stress disorder who [facility]/14/2019 for voluntary evaluation. The patient stated at that time he had been feeling suicidal for the last few days. He thought about taking a knife and cutting himself so he could "get some peace". The history of his treatment and running out of medications over the last 2 months is confusing and he is not a great historian. Initially he stated that [facility]. He stated that after he got out of [facility]He stayed there for 9 days, and then went [place] [facility] [place]. Then somehow or another [facility]. 09-16-19; Ronald Brooks is seen. Chart reviewed. The chart findings discussed with the treatment team. He is lying down in his bed. He is only visible on the unit during meal & medication administration times. He says he is doing better, only that he continues to have suicidal ideations. Denies any plans or intent to hurt himself. He is able to verbally contract for safety. He rates his depression at #8. He denies any AVH, delusional thoughts or paranoia. He is not attending group sessions. He is encouraged to come out of his room & attend group sessions. He is taking & tolerating his treatment regimen. He denies any adverse effects. Ronald Brooks is in agreement to continue current plan of care as already in  progress.  Principal Problem: Substance induced mood disorder (HCC)  Diagnosis: Principal Problem:   Substance induced mood disorder (HCC) Active Problems:   Bipolar 1 disorder (HCC)   Cocaine dependence (HCC)   Alcohol dependence (HCC)   Antisocial personality disorder (HCC)  Total Time spent with patient: 25 minutes  Past Psychiatric History: See H&P  Past Medical History:  Past Medical History:  Diagnosis Date  . ADHD   . Asthma   . Bipolar 1 disorder (HCC)    History reviewed. No pertinent surgical history. Family History: History reviewed. No pertinent family history.  Family Psychiatric  History: See H&P.  Social History:  Social History   Substance and Sexual Activity  Alcohol Use Yes   Comment: occasional      Social History   Substance and Sexual Activity  Drug Use No   Comment: has not used cocain and ETOH for 29 days    Social History   Socioeconomic History  . Marital status: Legally Separated    Spouse name: Not on file  . Number of children: Not on file  . Years of education: Not on file  . Highest education level: Not on file  Occupational History  . Not on file  Social Needs  . Financial resource strain: Not on file  . Food insecurity    Worry: Not on file    Inability: Not on file  . Transportation needs    Medical: Not on file    Non-medical: Not on file  Tobacco Use  . Smoking status:  Current Every Day Smoker    Packs/day: 0.50    Types: Cigarettes  . Smokeless tobacco: Never Used  Substance and Sexual Activity  . Alcohol use: Yes    Comment: occasional   . Drug use: No    Comment: has not used cocain and ETOH for 29 days  . Sexual activity: Not on file  Lifestyle  . Physical activity    Days per week: Not on file    Minutes per session: Not on file  . Stress: Not on file  Relationships  . Social Musician on phone: Not on file    Gets together: Not on file    Attends religious service: Not on file    Active  member of club or organization: Not on file    Attends meetings of clubs or organizations: Not on file    Relationship status: Not on file  Other Topics Concern  . Not on file  Social History Narrative  . Not on file   Additional Social History:  Pain Medications: see MAR Prescriptions: See MAR Over the Counter: See MAR History of alcohol / drug use?: Yes Negative Consequences of Use: Financial, Work / Programmer, multimedia, Personal relationships Withdrawal Symptoms: Agitation, Irritability  Sleep: Fair  Appetite:  Good  Current Medications: Current Facility-Administered Medications  Medication Dose Route Frequency Provider Last Rate Last Dose  . acetaminophen (TYLENOL) tablet 650 mg  650 mg Oral Q6H PRN Patrcia Dolly, FNP      . albuterol (VENTOLIN HFA) 108 (90 Base) MCG/ACT inhaler 1-2 puff  1-2 puff Inhalation Q6H PRN Patrcia Dolly, FNP      . alum & mag hydroxide-simeth (MAALOX/MYLANTA) 200-200-20 MG/5ML suspension 30 mL  30 mL Oral Q4H PRN Patrcia Dolly, FNP      . divalproex (DEPAKOTE) DR tablet 500 mg  500 mg Oral Q12H Patrcia Dolly, FNP   500 mg at 09/16/19 0757  . folic acid (FOLVITE) tablet 1 mg  1 mg Oral Daily Antonieta Pert, MD   1 mg at 09/16/19 0757  . LORazepam (ATIVAN) tablet 1 mg  1 mg Oral Q6H PRN Antonieta Pert, MD      . magnesium hydroxide (MILK OF MAGNESIA) suspension 30 mL  30 mL Oral Daily PRN Patrcia Dolly, FNP      . Melatonin TABS 3 mg  3 mg Oral QHS PRN Patrcia Dolly, FNP      . OLANZapine (ZYPREXA) tablet 10 mg  10 mg Oral BID Patrcia Dolly, FNP   10 mg at 09/16/19 0756  . OLANZapine zydis (ZYPREXA) disintegrating tablet 10 mg  10 mg Oral Q8H PRN Antonieta Pert, MD   10 mg at 09/15/19 1545   And  . ziprasidone (GEODON) injection 20 mg  20 mg Intramuscular PRN Antonieta Pert, MD      . pantoprazole (PROTONIX) EC tablet 40 mg  40 mg Oral Daily Antonieta Pert, MD   40 mg at 09/16/19 0758  . thiamine (VITAMIN B-1) tablet 100 mg  100 mg Oral Daily Antonieta Pert, MD   100 mg at 09/16/19 0757  . traZODone (DESYREL) tablet 50 mg  50 mg Oral QHS PRN Patrcia Dolly, FNP        Lab Results: No results found for this or any previous visit (from the past 48 hour(s)).  Blood Alcohol level:  Lab Results  Component Value Date   ETH <10 09/14/2019  ETH <10 09/03/2018   Metabolic Disorder Labs: No results found for: HGBA1C, MPG No results found for: PROLACTIN No results found for: CHOL, TRIG, HDL, CHOLHDL, VLDL, LDLCALC  Physical Findings: AIMS: Facial and Oral Movements Muscles of Facial Expression: None, normal Lips and Perioral Area: None, normal Jaw: None, normal Tongue: None, normal,Extremity Movements Upper (arms, wrists, hands, fingers): None, normal Lower (legs, knees, ankles, toes): None, normal, Trunk Movements Neck, shoulders, hips: None, normal, Overall Severity Severity of abnormal movements (highest score from questions above): None, normal Incapacitation due to abnormal movements: None, normal Patient's awareness of abnormal movements (rate only patient's report): No Awareness, Dental Status Current problems with teeth and/or dentures?: No Does patient usually wear dentures?: No  CIWA:  CIWA-Ar Total: 1 COWS:  COWS Total Score: 3  Musculoskeletal: Strength & Muscle Tone: within normal limits Gait & Station: normal Patient leans: N/A  Psychiatric Specialty Exam: Physical Exam  Nursing note and vitals reviewed. Constitutional: He is oriented to person, place, and time. He appears well-developed.  Neck: Normal range of motion.  Cardiovascular:  Elevated pulse rate: 127  Respiratory: Effort normal. No respiratory distress. He has no wheezes.  Genitourinary:    Genitourinary Comments: Deferred   Musculoskeletal: Normal range of motion.  Neurological: He is alert and oriented to person, place, and time.  Skin: Skin is warm and dry.    Review of Systems  Constitutional: Negative for chills and fever.   Respiratory: Negative for cough, shortness of breath and wheezing.   Cardiovascular: Negative for chest pain and palpitations.  Gastrointestinal: Negative for abdominal pain, heartburn, nausea and vomiting.  Musculoskeletal: Negative for myalgias.  Neurological: Negative for dizziness and headaches.  Psychiatric/Behavioral: Positive for depression, substance abuse (Hx. Cocaine use disorder) and suicidal ideas (Able to verbally contract for safety.). Negative for hallucinations and memory loss. The patient has insomnia. The patient is not nervous/anxious (Stable).     Blood pressure 93/76, pulse (!) 127, temperature 97.8 F (36.6 C), temperature source Oral, resp. rate 20, height  (1.778 m), weight 86.6 kg, SpO2 100 %.Body mass index is 27.41 kg/m.  General Appearance: Disheveled  Eye Contact:  Minimal  Speech:  Normal Rate  Volume:  Increased  Mood:  Irritable  Affect:  Congruent  Thought Process:  Coherent and Descriptions of Associations: Circumstantial  Orientation:  Full (Time, Place, and Person)  Thought Content:  Rumination  Suicidal Thoughts:  Yes.  without intent/plan  Homicidal Thoughts:  No  Memory:  Immediate;   Poor Recent;   Poor Remote;   Poor  Judgement:  Impaired  Insight:  Lacking  Psychomotor Activity:  Increased  Concentration:  Concentration: Poor and Attention Span: Poor  Recall:  Poor  Fund of Knowledge:  Fair  Language:  Fair  Akathisia:  Negative  Handed:  Right  AIMS (if indicated):     Assets:  Desire for Improvement Resilience  ADL's:  Intact  Cognition:  WNL    Sleep:  Number of Hours: 5.25   Treatment Plan Summary: Daily contact with patient to assess and evaluate symptoms and progress in treatment and Medication management.  -Continue inpatient hospitalization.  -Will continue today 09/16/2019 plan as below except where it is noted.  -Mood control           -Continue Olanzapine 10 mg po bid.  -Mood stabilization              -Continue Depakote DR 500 mg Q 12 hours.  -CIWA>10             -  Continue Lorazepam 1 mg Q 6 hours prn.  -Insomnia             -Continue Trazodone 50 mg po prn qhs.             -Continue Melatonin 3 mg po Q hs prn.  -Agitation                      -Continue geodon 20 mg IM prn  x 1 dose.             &             -Continue Olanzapine Zydis 10 mg po Q 8 hours prn             &             -Lorazepam 1 mg po as needed x 1 dose.      -Continue Thiamine 100 mg po daily for thiamine deficiency. -Continue Folic acid 1 mg po Qd for low Folate. -Continue albuterol inhaler 1-2 puffs Q 6 hours prn. -Continue Protonix 40 mg po Q am for GERD.  -Encourage participation in groups and therapeutic milieu  -Disposition planning will be ongoing  Lindell Spar, NP, PMHNP, FNP-BC 09/16/2019, 11:07 AM

## 2019-09-16 NOTE — Progress Notes (Signed)
Psychoeducational Group Note  Date:  09/16/2019 Time: 2030 Group Topic/Focus:  wrap up group  Participation Level: Did Not Attend  Participation Quality:  Not Applicable  Affect:  Not Applicable  Cognitive:  Not Applicable  Insight:  Not Applicable  Engagement in Group: Not Applicable  Additional Comments:  Pt was notified that group was beginning bur remained in bed.   Shellia Cleverly 09/16/2019, 9:15 PM

## 2019-09-16 NOTE — Progress Notes (Signed)
D:  Patient stated he does not have HI thoughts, contracts for safety.  Denied A/V hallucinations.  Stated he does have SI thoughts at times. D:  Medications administered per MD orders.  Emotional support and encouragement given patient. R:  Safety maintained with 15 minute checks.

## 2019-09-16 NOTE — BHH Group Notes (Signed)
[place] LCSW Group Therapy Note  09/16/2019    Type of Therapy and Topic:  Group Therapy:  A Hero Worthy of Support  Participation Level:  Did Not Attend   Description of Group:  Patients in this group were introduced to the concept that additional supports including self-support are an essential part of recovery.  Matching needs with supports to help fulfill those needs was explained.  A song "I Got To Live" was played for the group and was followed by a discussion of what it meant to participants.   The consensus was that the message was to give themselves permission to see happiness in life.  A song entitled "My Own Hero" was played and a group discussion ensued in which patients stated it inspired them to help themselves in order to succeed, because other people cannot achieve their goals such as sobriety or stability for them.  A song was played called "I Am Enough" which led to a discussion about being willing to believe we are worth the effort of being a self-support.   Therapeutic Goals: 1)  demonstrate the importance of being a key part of one's own support system 2)  discuss various available supports 3)  encourage patient to use music as part of their self-support and focus on goals 4)  elicit ideas from patients about supports that need to be added   Summary of Patient Progress:  The patient was invited to group but did not attend.  Therapeutic Modalities:   Motivational Interviewing Activity  Maretta Los

## 2019-09-17 LAB — TSH: TSH: 2.213 u[IU]/mL (ref 0.350–4.500)

## 2019-09-17 LAB — HEMOGLOBIN A1C
Hgb A1c MFr Bld: 5.3 % (ref 4.8–5.6)
Mean Plasma Glucose: 105.41 mg/dL

## 2019-09-17 MED ORDER — TRAZODONE HCL 100 MG PO TABS
100.0000 mg | ORAL_TABLET | Freq: Every day | ORAL | Status: DC
  Administered 2019-09-17: 100 mg via ORAL
  Filled 2019-09-17 (×3): qty 1

## 2019-09-17 MED ORDER — RAMELTEON 8 MG PO TABS
8.0000 mg | ORAL_TABLET | Freq: Every day | ORAL | Status: DC
  Administered 2019-09-17 – 2019-09-21 (×3): 8 mg via ORAL
  Filled 2019-09-17 (×2): qty 1
  Filled 2019-09-17: qty 7
  Filled 2019-09-17 (×4): qty 1
  Filled 2019-09-17: qty 7

## 2019-09-17 MED ORDER — OLANZAPINE 7.5 MG PO TABS
15.0000 mg | ORAL_TABLET | Freq: Every day | ORAL | Status: DC
  Administered 2019-09-17: 15 mg via ORAL
  Filled 2019-09-17 (×4): qty 2

## 2019-09-17 MED ORDER — OLANZAPINE 10 MG PO TABS
10.0000 mg | ORAL_TABLET | Freq: Every day | ORAL | Status: DC
  Administered 2019-09-18 – 2019-09-19 (×2): 10 mg via ORAL
  Filled 2019-09-17 (×4): qty 1

## 2019-09-17 NOTE — Progress Notes (Signed)
D:  Patient denied SI and HI, contracts for safety.  Denied A/V hallucinations.   A:  Medications administered per MD orders.  Emotional support and encouragement given patient. R:  Safety maintained with 15 minute checks.  

## 2019-09-17 NOTE — BHH Group Notes (Signed)
LCSW Group Therapy Note 09/17/2019 2:30 PM  Type of Therapy and Topic: Group Therapy: Overcoming Obstacles  Participation Level: Did Not Attend  Description of Group:  In this group patients will be encouraged to explore what they see as obstacles to their own wellness and recovery. They will be guided to discuss their thoughts, feelings, and behaviors related to these obstacles. The group will process together ways to cope with barriers, with attention given to specific choices patients can make. Each patient will be challenged to identify changes they are motivated to make in order to overcome their obstacles. This group will be process-oriented, with patients participating in exploration of their own experiences as well as giving and receiving support and challenge from other group members.  Therapeutic Goals: 1. Patient will identify personal and current obstacles as they relate to admission. 2. Patient will identify barriers that currently interfere with their wellness or overcoming obstacles.  3. Patient will identify feelings, thought process and behaviors related to these barriers. 4. Patient will identify two changes they are willing to make to overcome these obstacles:   Summary of Patient Progress  Invited, chose not to attend.     Therapeutic Modalities:  Cognitive Behavioral Therapy Solution Focused Therapy Motivational Interviewing Relapse Prevention Therapy   Theresa Duty Clinical Social Worker

## 2019-09-17 NOTE — Progress Notes (Signed)
[facility] MD Progress Note  09/17/2019 9:39 AM Izaia Say  MRN:  [id number] Subjective:   Patient is a 43 year old male with a past psychiatric history significant for cocaine dependence, reported bipolar disorder, reported posttraumatic stress disorder who presented to the [facility] emergency department on 09/14/2019 with suicidal ideation.  Objective: Patient is seen and examined.  Patient is a 43 year old male with the above-stated past psychiatric history who is seen in follow-up.  His first statement today on examination is "I am still suicidal".  He appears to be essentially unchanged from previous examination.  Last vital signs recorded are from 11/7.  He was tachycardic with a rate of 127, blood pressure was stable.  He slept 3.5 hours last night.  His last CIWA was 4 this AM.  His TSH from this morning was 2.213.  Principal Problem: Substance induced mood disorder (HCC) Diagnosis: Principal Problem:   Substance induced mood disorder (HCC) Active Problems:   Cocaine dependence (HCC)   Alcohol dependence (HCC)   Antisocial personality disorder (HCC)   Bipolar 1 disorder (HCC)  Total Time spent with patient: 15 minutes  Past Psychiatric History: See admission H&P  Past Medical History:  Past Medical History:  Diagnosis Date  . ADHD   . Asthma   . Bipolar 1 disorder (HCC)    History reviewed. No pertinent surgical history. Family History: History reviewed. No pertinent family history. Family Psychiatric  History: See admission H&P Social History:  Social History   Substance and Sexual Activity  Alcohol Use Yes   Comment: occasional      Social History   Substance and Sexual Activity  Drug Use No   Comment: has not used cocain and ETOH for 29 days    Social History   Socioeconomic History  . Marital status: Legally Separated    Spouse name: Not on file  . Number of children: Not on file  . Years of education: Not on file  . Highest education  level: Not on file  Occupational History  . Not on file  Social Needs  . Financial resource strain: Not on file  . Food insecurity    Worry: Not on file    Inability: Not on file  . Transportation needs    Medical: Not on file    Non-medical: Not on file  Tobacco Use  . Smoking status: Current Every Day Smoker    Packs/day: 0.50    Types: Cigarettes  . Smokeless tobacco: Never Used  Substance and Sexual Activity  . Alcohol use: Yes    Comment: occasional   . Drug use: No    Comment: has not used cocain and ETOH for 29 days  . Sexual activity: Not on file  Lifestyle  . Physical activity    Days per week: Not on file    Minutes per session: Not on file  . Stress: Not on file  Relationships  . Social Musician on phone: Not on file    Gets together: Not on file    Attends religious service: Not on file    Active member of club or organization: Not on file    Attends meetings of clubs or organizations: Not on file    Relationship status: Not on file  Other Topics Concern  . Not on file  Social History Narrative  . Not on file   Additional Social History:    Pain Medications: see MAR Prescriptions: See MAR Over the Counter: See [facility]  History of alcohol / drug use?: Yes Negative Consequences of Use: Financial, Work / Programmer, multimediachool, Personal relationships Withdrawal Symptoms: Agitation, Irritability                    Sleep: Fair  Appetite:  Fair  Current Medications: Current Facility-Administered Medications  Medication Dose Route Frequency Provider Last Rate Last Dose  . acetaminophen (TYLENOL) tablet 650 mg  650 mg Oral Q6H PRN Patrcia Dollyate, Tina L, FNP      . albuterol (VENTOLIN HFA) 108 (90 Base) MCG/ACT inhaler 1-2 puff  1-2 puff Inhalation Q6H PRN Patrcia Dollyate, Tina L, FNP      . alum & mag hydroxide-simeth (MAALOX/MYLANTA) 200-200-20 MG/5ML suspension 30 mL  30 mL Oral Q4H PRN Patrcia Dollyate, Tina L, FNP      . divalproex (DEPAKOTE) DR tablet 500 mg  500 mg Oral Q12H  Patrcia Dollyate, Tina L, FNP   500 mg at 09/17/19 0811  . folic acid (FOLVITE) tablet 1 mg  1 mg Oral Daily Antonieta Pertlary, Greg Lawson, MD   1 mg at 09/17/19 [id number]  . LORazepam (ATIVAN) tablet 1 mg  1 mg Oral Q6H PRN Antonieta Pertlary, Greg Lawson, MD   1 mg at 09/16/19 2127  . magnesium hydroxide (MILK OF MAGNESIA) suspension 30 mL  30 mL Oral Daily PRN Patrcia Dollyate, Tina L, FNP      . Melatonin TABS 3 mg  3 mg Oral QHS PRN Patrcia Dollyate, Tina L, FNP      . OLANZapine (ZYPREXA) tablet 10 mg  10 mg Oral BID Patrcia Dollyate, Tina L, FNP   10 mg at 09/17/19 [id number]  . OLANZapine zydis (ZYPREXA) disintegrating tablet 10 mg  10 mg Oral Q8H PRN Antonieta Pertlary, Greg Lawson, MD   10 mg at 09/15/19 1545   And  . ziprasidone (GEODON) injection 20 mg  20 mg Intramuscular PRN Antonieta Pertlary, Greg Lawson, MD      . pantoprazole (PROTONIX) EC tablet 40 mg  40 mg Oral Daily Antonieta Pertlary, Greg Lawson, MD   40 mg at 09/17/19 [id number]  . thiamine (VITAMIN B-1) tablet 100 mg  100 mg Oral Daily Antonieta Pertlary, Greg Lawson, MD   100 mg at 09/17/19 [id number]  . traZODone (DESYREL) tablet 50 mg  50 mg Oral QHS PRN Patrcia Dollyate, Tina L, FNP   50 mg at 09/16/19 2127    Lab Results:  Results for orders placed or performed during the hospital encounter of 09/15/19 (from the past 48 hour(s))  Hemoglobin A1c     Status: None   Collection Time: 09/17/19  6:20 AM  Result Value Ref Range   Hgb A1c MFr Bld 5.3 4.8 - 5.6 %    Comment: (NOTE) Pre diabetes:          5.7%-6.4% Diabetes:              >6.4% Glycemic control for   <7.0% adults with diabetes    Mean Plasma Glucose 105.41 mg/dL    Comment: Performed at [facility], [address] [place], [place] [address]  TSH     Status: None   Collection Time: 09/17/19  6:20 AM  Result Value Ref Range   TSH 2.213 0.350 - 4.500 uIU/mL    Comment: Performed by a 3rd Generation assay with a functional sensitivity of <=0.01 uIU/mL. Performed at [facility], [address] [place], [place] [address]     Blood Alcohol level:  Lab Results  Component Value  Date   ETH <10 09/14/2019   ETH <  10 09/03/2018    Metabolic Disorder Labs: Lab Results  Component Value Date   HGBA1C 5.3 09/17/2019   MPG 105.41 09/17/2019   No results found for: PROLACTIN No results found for: CHOL, TRIG, HDL, CHOLHDL, VLDL, LDLCALC  Physical Findings: AIMS: Facial and Oral Movements Muscles of Facial Expression: None, normal Lips and Perioral Area: None, normal Jaw: None, normal Tongue: None, normal,Extremity Movements Upper (arms, wrists, hands, fingers): None, normal Lower (legs, knees, ankles, toes): None, normal, Trunk Movements Neck, shoulders, hips: None, normal, Overall Severity Severity of abnormal movements (highest score from questions above): None, normal Incapacitation due to abnormal movements: None, normal Patient's awareness of abnormal movements (rate only patient's report): No Awareness, Dental Status Current problems with teeth and/or dentures?: No Does patient usually wear dentures?: No  CIWA:  CIWA-Ar Total: 4 COWS:  COWS Total Score: 3  Musculoskeletal: Strength & Muscle Tone: within normal limits Gait & Station: normal Patient leans: N/A  Psychiatric Specialty Exam: Physical Exam  Nursing note and vitals reviewed. Constitutional: He is oriented to person, place, and time. He appears well-developed and well-nourished.  HENT:  Head: Normocephalic and atraumatic.  Respiratory: Effort normal.  Neurological: He is alert and oriented to person, place, and time.    ROS  Blood pressure 93/76, pulse (!) 127, temperature 97.8 F (36.6 C), temperature source Oral, resp. rate 20, height 5\' 10"  (1.778 m), weight 86.6 kg, SpO2 100 %.Body mass index is 27.41 kg/m.  General Appearance: Disheveled  Eye Contact:  Fair  Speech:  Normal Rate  Volume:  Increased  Mood:  Anxious, Dysphoric and Irritable  Affect:  Congruent  Thought Process:  Coherent and Descriptions of Associations: Circumstantial  Orientation:  Full (Time, Place, and  Person)  Thought Content:  Rumination  Suicidal Thoughts:  Yes.  without intent/plan  Homicidal Thoughts:  No  Memory:  Immediate;   Fair Recent;   Fair Remote;   Fair  Judgement:  Impaired  Insight:  Lacking  Psychomotor Activity:  Increased  Concentration:  Concentration: Fair and Attention Span: Fair  Recall:  of Knowledge:  Fair  Language:  Fair  Akathisia:  Negative  Handed:  Right  AIMS (if indicated):     Assets:  Desire for Improvement Resilience  ADL's:  Intact  Cognition:  WNL  Sleep:  Number of Hours: 3.5     Treatment Plan Summary: Daily contact with patient to assess and evaluate symptoms and progress in treatment, Medication management and Plan : Patient is seen and examined.  Patient is a 43 year old male with the above-stated past psychiatric history who is seen in follow-up.   Diagnosis: #1 substance-induced mood disorder, #2 cocaine dependence, #3 alcohol dependence, #4 antisocial personality disorder, #5 reported history of bipolar disorder, #6 reported history of posttraumatic stress disorder.  Patient is seen in follow-up.  He is essentially unchanged from when I saw him on 09/15/2019.  He continues to endorse suicidal ideation, and is not sleeping well.  His current medications include Depakote DR 500 mg p.o. every 12 hours, Zyprexa 10 mg p.o. twice daily, and trazodone 50 mg p.o. nightly.  I will increase his trazodone to 100 mg p.o. nightly to make that standing.  Additionally I will increase his Zyprexa at bedtime to 15 mg p.o. nightly.  We will continue the 10 mg p.o. daily.  I will also stop the melatonin and substitute Rozerem.  No other changes in his medications at this point. 1.  Continue Depakote DR 500  mg p.o. every 12 hours for mood stability. 2.  Continue folic acid 1 mg p.o. daily for nutritional supplementation. 3.  Continue lorazepam 1 mg p.o. every 6 hours as needed a CIWA greater than 10. 4.  Stop melatonin. 5.  Start Rozerem 8 mg  p.o. nightly.  This is for insomnia. 6.  Change Zyprexa to 10 mg p.o. daily and 15 mg p.o. nightly for mood stability, psychosis and insomnia. 7.  Continue pantoprazole 40 mg p.o. daily for gastric protection. 8.  Continue thiamine 100 mg p.o. daily for nutritional supplementation. 9.  Increase trazodone to 100 mg p.o. nightly and change to standing for insomnia. 10.  Disposition planning-in progress.  [name], MD 09/17/2019, 9:39 AM

## 2019-09-17 NOTE — BHH Counselor (Signed)
Adult Comprehensive Assessment  Patient ID: Ronald Brooks, male   DOB: 10-Nov-1975, 43 y.o.   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 Family Relationships: "Some I do, some I don'tEngineer, petroleum / Lack of resources (include bankruptcy): Refused to answer Housing / Lack of housing: "Something like an [employer]" Physical health (include injuries & life threatening diseases): "I need meds:" Social relationships: Refused to answer Substance abuse: Refused to answer Bereavement / Loss: Refused to answer  Living/Environment/Situation:  Living Arrangements: Non-relatives/Friends Living conditions (as described by patient or guardian): "Something like an [employer]" Who else lives in the home?: Other residents How long has patient lived in current situation?: 1 week  Family History:  What is your sexual orientation?: Refused to answer Has your sexual activity been affected by drugs, alcohol, medication, or emotional stress?: Refused to answer  Childhood History:  Additional childhood history information: Refused to answer Description of patient's relationship with caregiver when they were a child: Refused to answer Patient's description of current relationship with people who raised him/her: Refused to answer How were you disciplined when you got in trouble as a child/adolescent?: Refused to answer  Education:  Highest grade of school patient has completed: Refused to answer  Employment/Work Situation:   Employment situation: Employed 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 How long has  patient been employed?: Refused to answer What is the longest time patient has a held a job?: Refused to answer Where was the patient employed at that time?: Refused to Tour manager Resources:      Alcohol/Substance Abuse:   What has been your use of drugs/alcohol within the last 12 months?: Refused to answer If yes, describe treatment: Refused to answer  Social Support System:   Describe Community Support System: Refused to answer Type of faith/religion: Refused to answer How does patient's faith help to cope with current illness?: Refused to answer  Leisure/Recreation:   Leisure and Hobbies: Refused to answer  Strengths/Needs:   What is the patient's perception of their strengths?: Refused to answer Patient states they can use these personal strengths during their treatment to contribute to their recovery: Refused to answer Patient states these barriers may affect/interfere with their treatment: Refused to answer Patient states these barriers may affect their return to the community: Refused to answer Other important information patient would like considered in planning for their treatment: Refused to answer  Discharge Plan:   Currently receiving community mental health services: No Patient states concerns and preferences for aftercare planning are: "I need meds." Patient states they will know when they are safe and ready for discharge when: Refused to answer Patient description of barriers related to discharge medications: No insurance Will patient be returning to same living situation after discharge?: (CSW encouraged patient to call.)  Summary/Recommendations:   Summary and Recommendations (to be completed by the evaluator): Ronald Brooks is a 43 year old male from [facility] [place]). He presents to [facility] voluntarily seeking treatment for SI. He refused to answer nearly all assessment questions and has been irritable and verbally abusive toward staff. He states he needs  medication management and will be referred to [facility]. While here, he can benefit from  crisis stabilization, medication management, therapeutic milieu, and referrals for services.  Ronald Brooks. 09/17/2019

## 2019-09-17 NOTE — Tx Team (Signed)
Interdisciplinary Treatment and Diagnostic Plan Update  09/17/2019 Time of Session: 9:00am Ronald Brooks MRN: [id number]  Principal Diagnosis: Substance induced mood disorder ([name])  Secondary Diagnoses: Principal Problem:   Substance induced mood disorder ([name]) Active Problems:   Bipolar 1 disorder ([name])   Cocaine dependence ([name])   Alcohol dependence ([name])   Antisocial personality disorder ([name])   Current Medications:  Current Facility-Administered Medications  Medication Dose Route Frequency Provider Last Rate Last Dose  . acetaminophen (TYLENOL) tablet 650 mg  650 mg Oral Q6H PRN [name], FNP      . albuterol (VENTOLIN HFA) 108 (90 Base) MCG/ACT inhaler 1-2 puff  1-2 puff Inhalation Q6H PRN [name], FNP      . alum & mag hydroxide-simeth (MAALOX/MYLANTA) 200-200-20 MG/5ML suspension 30 mL  30 mL Oral Q4H PRN [name], FNP      . divalproex (DEPAKOTE) DR tablet 500 mg  500 mg Oral Q12H [name], FNP   500 mg at 09/17/19 0811  . folic acid (FOLVITE) tablet 1 mg  1 mg Oral Daily [name], MD   1 mg at 09/17/19 2633  . LORazepam (ATIVAN) tablet 1 mg  1 mg Oral Q6H PRN [name], MD   1 mg at 09/16/19 2127  . magnesium hydroxide (MILK OF MAGNESIA) suspension 30 mL  30 mL Oral Daily PRN [name], FNP      . [START ON 09/18/2019] OLANZapine (ZYPREXA) tablet 10 mg  10 mg Oral Daily [name], MD      . OLANZapine [name]) tablet 15 mg  15 mg Oral QHS [name], MD      . OLANZapine zydis [name]) disintegrating tablet 10 mg  10 mg Oral Q8H PRN [name], MD   10 mg at 09/15/19 1545   And  . ziprasidone (GEODON) injection 20 mg  20 mg Intramuscular PRN [name], MD      . pantoprazole (PROTONIX) EC tablet 40 mg  40 mg Oral Daily [name], MD   40 mg at 09/17/19 3545  . ramelteon (ROZEREM) tablet 8 mg  8 mg Oral QHS [name], MD      . thiamine (VITAMIN B-1) tablet 100 mg  100 mg Oral Daily  [name], MD   100 mg at 09/17/19 6256  . traZODone (DESYREL) tablet 100 mg  100 mg Oral QHS [name], MD       PTA Medications: Medications Prior to Admission  Medication Sig Dispense Refill Last Dose  . albuterol (VENTOLIN HFA) 108 (90 Base) MCG/ACT inhaler Inhale 1-2 puffs into the lungs every 6 (six) hours as needed for wheezing or shortness of breath.     . divalproex (DEPAKOTE) 500 MG DR tablet Take 1 tablet (500 mg total) by mouth every 12 (twelve) hours. 10 tablet 0   . OLANZapine (ZYPREXA) 10 MG tablet Take 1 tablet (10 mg total) by mouth 2 (two) times daily. 10 tablet 0     Patient Stressors: Medication change or noncompliance Occupational concerns Substance abuse  Patient Strengths: Work Counselling psychologist Modalities: Medication Management, Group therapy, Case management,  1 to 1 session with clinician, Psychoeducation, Recreational therapy.   Physician Treatment Plan for Primary Diagnosis: Substance induced mood disorder ([place]) Long Term Goal(s): Improvement in symptoms so as ready for discharge Improvement in symptoms so as ready for discharge   Short Term Goals: Ability to identify changes in  lifestyle to reduce recurrence of condition will improve Ability to verbalize feelings will improve Ability to disclose and discuss suicidal ideas Ability to demonstrate self-control will improve Ability to identify and develop effective coping behaviors will improve Ability to maintain clinical measurements within normal limits will improve Compliance with prescribed medications will improve Ability to identify triggers associated with substance abuse/mental health issues will improve Ability to identify changes in lifestyle to reduce recurrence of condition will improve Ability to verbalize feelings will improve Ability to disclose and discuss suicidal ideas Ability to demonstrate self-control will improve Ability to identify and develop effective coping  behaviors will improve Ability to maintain clinical measurements within normal limits will improve Compliance with prescribed medications will improve Ability to identify triggers associated with substance abuse/mental health issues will improve  Medication Management: Evaluate patient's response, side effects, and tolerance of medication regimen.  Therapeutic Interventions: 1 to 1 sessions, Unit Group sessions and Medication administration.  Evaluation of Outcomes: Not Met  Physician Treatment Plan for Secondary Diagnosis: Principal Problem:   Substance induced mood disorder ([place]) Active Problems:   Bipolar 1 disorder ([place])   Cocaine dependence ([name])   Alcohol dependence ([place])   Antisocial personality disorder ([name])  Long Term Goal(s): Improvement in symptoms so as ready for discharge Improvement in symptoms so as ready for discharge   Short Term Goals: Ability to identify changes in lifestyle to reduce recurrence of condition will improve Ability to verbalize feelings will improve Ability to disclose and discuss suicidal ideas Ability to demonstrate self-control will improve Ability to identify and develop effective coping behaviors will improve Ability to maintain clinical measurements within normal limits will improve Compliance with prescribed medications will improve Ability to identify triggers associated with substance abuse/mental health issues will improve Ability to identify changes in lifestyle to reduce recurrence of condition will improve Ability to verbalize feelings will improve Ability to disclose and discuss suicidal ideas Ability to demonstrate self-control will improve Ability to identify and develop effective coping behaviors will improve Ability to maintain clinical measurements within normal limits will improve Compliance with prescribed medications will improve Ability to identify triggers associated with substance abuse/mental health issues will improve      Medication Management: Evaluate patient's response, side effects, and tolerance of medication regimen.  Therapeutic Interventions: 1 to 1 sessions, Unit Group sessions and Medication administration.  Evaluation of Outcomes: Not Met   RN Treatment Plan for Primary Diagnosis: Substance induced mood disorder ([place]) Long Term Goal(s): Knowledge of disease and therapeutic regimen to maintain health will improve  Short Term Goals: Ability to verbalize feelings will improve, Ability to identify and develop effective coping behaviors will improve and Compliance with prescribed medications will improve  Medication Management: RN will administer medications as ordered by provider, will assess and evaluate patient's response and provide education to patient for prescribed medication. RN will report any adverse and/or side effects to prescribing provider.  Therapeutic Interventions: 1 on 1 counseling sessions, Psychoeducation, Medication administration, Evaluate responses to treatment, Monitor vital signs and CBGs as ordered, Perform/monitor CIWA, COWS, AIMS and Fall Risk screenings as ordered, Perform wound care treatments as ordered.  Evaluation of Outcomes: Not Met   LCSW Treatment Plan for Primary Diagnosis: Substance induced mood disorder ([place]) Long Term Goal(s): Safe transition to appropriate next level of care at discharge, Engage patient in therapeutic group addressing interpersonal concerns.  Short Term Goals: Engage patient in aftercare planning with referrals and resources, Increase social support, Increase emotional regulation, Identify triggers associated with  mental health/substance abuse issues and Increase skills for wellness and recovery  Therapeutic Interventions: Assess for all discharge needs, 1 to 1 time with Social worker, Explore available resources and support systems, Assess for adequacy in community support network, Educate family and significant other(s) on suicide  prevention, Complete Psychosocial Assessment, Interpersonal group therapy.  Evaluation of Outcomes: Not Met   Progress in Treatment: Attending groups: No. Participating in groups: No. Taking medication as prescribed: Yes. Toleration medication: Yes. Family/Significant other contact made: No, will contact:  supports if consents are granted. Patient understands diagnosis: Yes. Discussing patient identified problems/goals with staff: No. Medical problems stabilized or resolved: Yes. Denies suicidal/homicidal ideation: Yes. Issues/concerns per patient self-inventory: No.  New problem(s) identified: No, Describe:  CSW continuing to assess  New Short Term/Long Term Goal(s): detox, medication management for mood stabilization; elimination of SI thoughts; development of comprehensive mental wellness/sobriety plan.  Patient Goals:  "Try to get meds and not feel suicidal."  Discharge Plan or Barriers: Patient has not met with social work team at this time. CSW assessing for appropriate referrals.   Reason for Continuation of Hospitalization: Anxiety Depression Medication stabilization Suicidal ideation  Estimated Length of Stay: 1-3 days  Attendees: Patient: Ronald Brooks 09/17/2019 10:11 AM  Physician: Queen Blossom 09/17/2019 10:11 AM  Nursing: Rise Paganini, RN 09/17/2019 10:11 AM  RN Care Manager: 09/17/2019 10:11 AM  Social Worker: Stephanie Acre, [place] 09/17/2019 10:11 AM  Recreational Therapist:  09/17/2019 10:11 AM  Other: Harriett Sine, NP  09/17/2019 10:11 AM  Other:  09/17/2019 10:11 AM  Other: 09/17/2019 10:11 AM    Scribe for Treatment Team: [name], [place] 09/17/2019 10:11 AM

## 2019-09-17 NOTE — [facility] Counselor (Signed)
CSW attempted to meet with patient for an assessment. Patient initially agreed, then angrily asked CSW why she woke him up at "one in the fuckin' morning," CSW informed patient it is 1:45 in the afternoon.  Patient refused to answer all questions stating, "I'm not answering any of your stupid fucking questions," and proceeded to verbally abusive CSW.   Patient endorsed he is staying in "something like an [place]," in [place]. He wants outpatient medication management. He agreed to being referred to [facility] for medication management.   CSW encouraged patient to call his housemates and plan for discharge.  Stephanie Acre, LCSW-A Clinical Social Worker

## 2019-09-17 NOTE — Progress Notes (Signed)
Psychoeducational Group Note  Date:  09/17/2019 Time:  2041  Group Topic/Focus:  Wrap-Up Group:   The focus of this group is to help patients review their daily goal of treatment and discuss progress on daily workbooks.  Participation Level: Did Not Attend  Participation Quality:  Not Applicable  Affect:  Not Applicable  Cognitive:  Not Applicable  Insight:  Not Applicable  Engagement in Group: Not Applicable  Additional Comments:  The patient was encouraged to attend group and then receive a snack, but he replied that he would not attend a "fucking group".   Archie Balboa S 09/17/2019, 8:41 PM

## 2019-09-17 NOTE — Plan of Care (Signed)
Nurse discussed anxiety, depression and coping skills with patient.  

## 2019-09-17 NOTE — Progress Notes (Signed)
Recreation Therapy Notes  Date:  11.9.20 Time: 0930 Location: 300 [address] Group Room  Group Topic: Stress Management  Goal Area(s) Addresses:  Patient will identify positive stress management techniques. Patient will identify benefits of using stress management post d/c.  Intervention:  Stress Management  Activity :  Meditation.  LRT introduced the stress management technique of meditation.  LRT played a meditation that focused on taking on the characteristics of a mountain.  Patients were to listen and follow as meditation was played to engage in activity.  Education:  Stress Management, Discharge Planning.   Education Outcome: Acknowledges Education  Clinical Observations/Feedback: Pt did not attend group.    Victorino Sparrow, LRT/CTRS         Victorino Sparrow A 09/17/2019 11:17 AM

## 2019-09-17 NOTE — [facility] Suicide Risk Assessment (Signed)
[place] INPATIENT:  Family/Significant Other Suicide Prevention Education  Suicide Prevention Education:  Patient Refusal for Family/Significant Other Suicide Prevention Education: The patient Ronald Brooks has refused to provide written consent for family/significant other to be provided Family/Significant Other Suicide Prevention Education during admission and/or prior to discharge.  Physician notified.  [name] 09/17/2019, 2:10 PM

## 2019-09-18 MED ORDER — TRAZODONE HCL 50 MG PO TABS
50.0000 mg | ORAL_TABLET | Freq: Every evening | ORAL | Status: DC | PRN
  Filled 2019-09-18: qty 1

## 2019-09-18 NOTE — Progress Notes (Signed)
Patient ID: Ronald Brooks, male   DOB: 12-Jan-1976, 43 y.o.   MRN: [id number]   D: Patient was in bed after shift change and did not come up for medication so I took them to his room. Patient was angry saying "why didn't I get these earlier". Patient really would not talk to undersigned saying "just give me my meds". Patient rude with staff. Took meds and went back to sleep. A: Staff will monitor on q 15 minute checks, follow treatment plan, and give medications as ordered. R: Irritable but did not act out

## 2019-09-18 NOTE — Progress Notes (Signed)
Recreation Therapy Notes  Animal-Assisted Activity (AAA) Program Checklist/Progress Notes Patient Eligibility Criteria Checklist & Daily Group note for Rec Tx Intervention  Date: 11.10.20 Time: 72 Location: [address]   AAA/T Program Assumption of Risk Form signed by Teacher, music or Parent Legal Guardian  YES   Patient is free of allergies or sever asthma  YES   Patient reports no fear of animals  YES   Patient reports no history of cruelty to animals  YES   Patient understands his/her participation is voluntary YES   Patient washes hands before animal contact YES   Patient washes hands after animal contact  YES   Education: Contractor, Appropriate Animal Interaction   Education Outcome: Acknowledges understanding/In group clarification offered/Needs additional education.   Clinical Observations/Feedback: Pt did not attend group activity.    Victorino Sparrow, LRT/CTRS         Victorino Sparrow A 09/18/2019 3:36 PM

## 2019-09-18 NOTE — Progress Notes (Signed)
Psychoeducational Group Note  Date:  09/18/2019 Time:  2055  Group Topic/Focus:  Wrap-Up Group:   The focus of this group is to help patients review their daily goal of treatment and discuss progress on daily workbooks.  Participation Level: Did Not Attend  Participation Quality:  Not Applicable  Affect:  Not Applicable  Cognitive:  Not Applicable  Insight:  Not Applicable  Engagement in Group: Not Applicable  Additional Comments:  The patient did not attend group this evening.   Archie Balboa S 09/18/2019, 8:55 PM

## 2019-09-18 NOTE — Plan of Care (Signed)
  Problem: Activity: Goal: Interest or engagement in leisure activities will improve Outcome: Not Progressing   Problem: Education: Goal: Emotional status will improve Outcome: Not Progressing   Problem: Safety: Goal: Ability to remain free from injury will improve Outcome: Progressing

## 2019-09-18 NOTE — Progress Notes (Signed)
Patient ID: Rasul Starzyk, male   DOB: 03/15/1976, 43 y.o.   MRN: [id number]   [place] NOVEL CORONAVIRUS (COVID-19) DAILY CHECK-OFF SYMPTOMS - answer yes or no to each - every day NO YES  Have you had a fever in the past 24 hours?  . Fever (Temp > 37.80C / 100F) X   Have you had any of these symptoms in the past 24 hours? . New Cough .  Sore Throat  .  Shortness of Breath .  Difficulty Breathing .  Unexplained Body Aches   X   Have you had any one of these symptoms in the past 24 hours not related to allergies?   . Runny Nose .  Nasal Congestion .  Sneezing   X   If you have had runny nose, nasal congestion, sneezing in the past 24 hours, has it worsened?  X   EXPOSURES - check yes or no X   Have you traveled outside the state in the past 14 days?  X   Have you been in contact with someone with a confirmed diagnosis of COVID-19 or PUI in the past 14 days without wearing appropriate PPE?  X   Have you been living in the same home as a person with confirmed diagnosis of COVID-19 or a PUI (household contact)?    X   Have you been diagnosed with COVID-19?    X              What to do next: Answered NO to all: Answered YES to anything:   Proceed with unit schedule Follow the [facility] Inpatient Flowsheet.   

## 2019-09-18 NOTE — Progress Notes (Signed)
[facility] MD Progress Note  09/18/2019 4:33 PM Osiel Stick  MRN:  [id number] Subjective:   Patient endorses some improvement. Reports passive SI , but denies plan or intentions and contracts for safety on unit . Denies medication side effects.   Objective : I have discussed case with treatment team and met with patient. 43 year old male, history of reported bipolar disorder, PTSD, cocaine use disorder. Presented to ED on 11/6 describing depression, suicidal ideations with thoughts of cutting himself, poor sleep, cocaine abuse.  Admission BAL negative, admission UDS positive for cocaine.  Home medications were listed as Depakote and Zyprexa.  Patient presents alert and attentive, tends to be irritable with curt/short answers.  Staff reports patient has been irritable with limited milieu participation thus far. Currently endorses intermittent passive SI but without any plan or intention and contracts for safety on unit.  Denies medication side effects. Denies hallucinations and does not appear internally preoccupied. No overtly agitated or restless behaviors, as per staff mostly in his room/bed.  As above, presents dysphoric/irritable.    Principal Problem: Substance induced mood disorder (HCC) Diagnosis: Principal Problem:   Substance induced mood disorder (HCC) Active Problems:   Bipolar 1 disorder (HCC)   Cocaine dependence (HCC)   Alcohol dependence ([place])   Antisocial personality disorder ([place])  Total Time spent with patient: 15 minutes  Past Psychiatric History: See admission H&P  Past Medical History:  Past Medical History:  Diagnosis Date  . ADHD   . Asthma   . Bipolar 1 disorder ([place])    History reviewed. No pertinent surgical history. Family History: History reviewed. No pertinent family history. Family Psychiatric  History: See admission H&P Social History:  Social History   Substance and Sexual Activity  Alcohol Use Yes   Comment: occasional      Social History    Substance and Sexual Activity  Drug Use No   Comment: has not used cocain and ETOH for 29 days    Social History   Socioeconomic History  . Marital status: Legally Separated    Spouse name: Not on file  . Number of children: Not on file  . Years of education: Not on file  . Highest education level: Not on file  Occupational History  . Not on file  Social Needs  . Financial resource strain: Not on file  . Food insecurity    Worry: Not on file    Inability: Not on file  . Transportation needs    Medical: Not on file    Non-medical: Not on file  Tobacco Use  . Smoking status: Current Every Day Smoker    Packs/day: 0.50    Types: Cigarettes  . Smokeless tobacco: Never Used  Substance and Sexual Activity  . Alcohol use: Yes    Comment: occasional   . Drug use: No    Comment: has not used cocain and ETOH for 29 days  . Sexual activity: Not on file  Lifestyle  . Physical activity    Days per week: Not on file    Minutes per session: Not on file  . Stress: Not on file  Relationships  . Social Herbalist on phone: Not on file    Gets together: Not on file    Attends religious service: Not on file    Active member of club or organization: Not on file    Attends meetings of clubs or organizations: Not on file    Relationship status: Not on file  Other  Topics Concern  . Not on file  Social History Narrative  . Not on file   Additional Social History:    Pain Medications: see MAR Prescriptions: See MAR Over the Counter: See MAR History of alcohol / drug use?: Yes Negative Consequences of Use: Financial, Work / Youth worker, Personal relationships Withdrawal Symptoms: Agitation, Irritability  Sleep: Fair/improving  Appetite:  Fair  Current Medications: Current Facility-Administered Medications  Medication Dose Route Frequency Provider Last Rate Last Dose  . acetaminophen (TYLENOL) tablet 650 mg  650 mg Oral Q6H PRN [name], FNP      . albuterol  (VENTOLIN HFA) 108 (90 Base) MCG/ACT inhaler 1-2 puff  1-2 puff Inhalation Q6H PRN [name], FNP      . alum & mag hydroxide-simeth (MAALOX/MYLANTA) 200-200-20 MG/5ML suspension 30 mL  30 mL Oral Q4H PRN [name], FNP      . divalproex (DEPAKOTE) DR tablet 500 mg  500 mg Oral Q12H [name], FNP   500 mg at 09/18/19 0814  . folic acid (FOLVITE) tablet 1 mg  1 mg Oral Daily [name], MD   1 mg at 09/17/19 0998  . LORazepam (ATIVAN) tablet 1 mg  1 mg Oral Q6H PRN [name], MD   1 mg at 09/17/19 1412  . magnesium hydroxide (MILK OF MAGNESIA) suspension 30 mL  30 mL Oral Daily PRN [name], FNP      . OLANZapine (ZYPREXA) tablet 10 mg  10 mg Oral Daily [name], MD   10 mg at 09/18/19 0815  . OLANZapine (ZYPREXA) tablet 15 mg  15 mg Oral QHS [name], MD   15 mg at 09/17/19 2234  . OLANZapine zydis (ZYPREXA) disintegrating tablet 10 mg  10 mg Oral Q8H PRN [name], MD   10 mg at 09/15/19 1545   And  . ziprasidone (GEODON) injection 20 mg  20 mg Intramuscular PRN [name], MD      . pantoprazole (PROTONIX) EC tablet 40 mg  40 mg Oral Daily [name], MD   40 mg at 09/17/19 3382  . ramelteon (ROZEREM) tablet 8 mg  8 mg Oral QHS [name], MD   8 mg at 09/17/19 2234  . thiamine (VITAMIN B-1) tablet 100 mg  100 mg Oral Daily [name], MD   100 mg at 09/17/19 5053  . traZODone (DESYREL) tablet 100 mg  100 mg Oral QHS [name], MD   100 mg at 09/17/19 2232    Lab Results:  Results for orders placed or performed during the hospital encounter of 09/15/19 (from the past 48 hour(s))  Hemoglobin A1c     Status: None   Collection Time: 09/17/19  6:20 AM  Result Value Ref Range   Hgb A1c MFr Bld 5.3 4.8 - 5.6 %    Comment: (NOTE) Pre diabetes:          5.7%-6.4% Diabetes:              >6.4% Glycemic control for   <7.0% adults with diabetes    Mean Plasma Glucose 105.41 mg/dL    Comment:  Performed at [facility], [place] [address], [place], [place] [address]  TSH     Status: None   Collection Time: 09/17/19  6:20 AM  Result Value Ref Range   TSH 2.213 0.350 - 4.500 uIU/mL    Comment: Performed by a 3rd Generation  assay with a functional sensitivity of <=0.01 uIU/mL. Performed at [facility], [place] [address], [place], [place] [address]     Blood Alcohol level:  Lab Results  Component Value Date   [facility] <10 09/14/2019   ETH <10 35/36/1443    Metabolic Disorder Labs: Lab Results  Component Value Date   HGBA1C 5.3 09/17/2019   MPG 105.41 09/17/2019   No results found for: PROLACTIN No results found for: CHOL, TRIG, HDL, CHOLHDL, VLDL, LDLCALC  Physical Findings: AIMS: Facial and Oral Movements Muscles of Facial Expression: None, normal Lips and Perioral Area: None, normal Jaw: None, normal Tongue: None, normal,Extremity Movements Upper (arms, wrists, hands, fingers): None, normal Lower (legs, knees, ankles, toes): None, normal, Trunk Movements Neck, shoulders, hips: None, normal, Overall Severity Severity of abnormal movements (highest score from questions above): None, normal Incapacitation due to abnormal movements: None, normal Patient's awareness of abnormal movements (rate only patient's report): No Awareness, Dental Status Current problems with teeth and/or dentures?: No Does patient usually wear dentures?: No  CIWA:  CIWA-Ar Total: 1 COWS:  COWS Total Score: 3  Musculoskeletal: Strength & Muscle Tone: within normal limits Gait & Station: normal Patient leans: N/A  Psychiatric Specialty Exam: Physical Exam  Nursing note and vitals reviewed. Constitutional: He is oriented to person, place, and time. He appears well-developed and well-nourished.  HENT:  Head: Normocephalic and atraumatic.  Respiratory: Effort normal.  Neurological: He is alert and oriented to person, place, and time.    ROS denies chest pain or shortness  of breath, no dyspnea, no vomiting, no fever, no chills  Blood pressure 102/62, pulse 69, temperature 97.8 F (36.6 C), temperature source Oral, resp. rate 20, height '5\' 10"'$  (1.778 m), weight 86.6 kg, SpO2 100 %.Body mass index is 27.41 kg/m.  General Appearance: Fairly Groomed  Eye Contact:  Fair  Speech:  Normal Rate  Volume:  Normal  Mood:  Dysphoric, irritable  Affect:  Congruent  Thought Process:  Linear and Descriptions of Associations: Intact  Orientation:  Other:  alert and attentive  Thought Content:  Currently denies hallucinations and does not appear internally preoccupied.  No delusions are expressed  Suicidal Thoughts:  Yes.  without intent/plan contracts for safety on unit and presents future oriented  Homicidal Thoughts:  No  Memory:  Recent and remote fair  Judgement:  Fair  Insight:  Fair  Psychomotor Activity:  Normal-currently in bed, appears calm/comfortable and in no acute distress  Concentration:  Concentration: Fair and Attention Span: Fair  Recall:  [employer] of Knowledge:  Fair  Language:  Fair  Akathisia:  Negative  Handed:  Right  AIMS (if indicated):     Assets:  Desire for Improvement Resilience  ADL's:  Intact  Cognition:  WNL  Sleep:  Number of Hours: 5.75    Assessment:  43 year old male, history of reported bipolar disorder, PTSD, cocaine use disorder. Presented to ED on 11/6 describing depression, suicidal ideations with thoughts of cutting himself, poor sleep, cocaine abuse.  Admission BAL negative, admission UDS positive for cocaine.  Home medications were listed as Depakote and Zyprexa.  Patient is currently presenting with some irritability/dysphoria.  Endorses some passive SI but denies actual suicidal plan or intention and contracts for safety on unit.  He also presents future oriented, expressing some interest in discussing disposition planning options.  Limited milieu participation thus far.  Tolerating medications well, denies side  effects. Treatment Plan Summary: Treatment plan reviewed as below today 11/10 Encourage  group and milieu participation Encourage efforts to work on sobriety and relapse prevention Treatment team working on disposition planning options 1.  Continue Depakote DR 500 mg p.o. every 12 hours for mood disorder 2.  Continue folic acid 1 mg p.o. daily  3.  Continue Lorazepam 1 mg p.o. every 6 hours as needed a CIWA greater than 10. 4.  Continue Rozerem 8 mg p.o. nightly for insomnia 5.  Continue Zyprexa  10 mg p.o. daily and 15 mg p.o. nightly for mood disorder, psychosis and insomnia. 6.  Continue Protonix 40 mg p.o. daily for gastric protection. 7.  Continue thiamine /folate supplementation 8.  Decrease Trazodone to 50 mg nightly as needed for insomnia   Jenne Campus, MD 09/18/2019, 4:33 PM   Patient ID: [name] Catholic, male   DOB: 12-Mar-1976, 43 y.o.   MRN: [id number]

## 2019-09-19 DIAGNOSIS — F1020 Alcohol dependence, uncomplicated: Secondary | ICD-10-CM

## 2019-09-19 DIAGNOSIS — F1420 Cocaine dependence, uncomplicated: Secondary | ICD-10-CM

## 2019-09-19 MED ORDER — OLANZAPINE 10 MG PO TABS
20.0000 mg | ORAL_TABLET | Freq: Every day | ORAL | Status: DC
  Administered 2019-09-20 – 2019-09-21 (×2): 20 mg via ORAL
  Filled 2019-09-19 (×2): qty 2
  Filled 2019-09-19: qty 14
  Filled 2019-09-19 (×3): qty 2
  Filled 2019-09-19: qty 14

## 2019-09-19 NOTE — Progress Notes (Signed)
Adult Psychoeducational Group Note  Date:  09/19/2019 Time:  4:22 PM  Group Topic/Focus:  Making Healthy Choices:   The focus of this group is to help patients identify negative/unhealthy choices they were using prior to admission and identify positive/healthier coping strategies to replace them upon discharge.  Participation Level:  Did Not Attend  Pt was informed that group will be starting in the dayroom by the MHT. Pt chose not to attend group.   Lita Mains 09/19/2019, 4:22 PM

## 2019-09-19 NOTE — Progress Notes (Signed)
Psychoeducational Group Note  Date:  09/19/2019 Time:  2045  Group Topic/Focus:  wrap up group  Participation Level: Did Not Attend  Participation Quality:  Not Applicable  Affect:  Not Applicable  Cognitive:  Not Applicable  Insight:  Not Applicable  Engagement in Group: Not Applicable  Additional Comments:  Pt was notified that group was beginning but remained in bed.   Shellia Cleverly 09/19/2019, 9:45 PM

## 2019-09-19 NOTE — [facility] Group Notes (Signed)
09/19/2019 8:45am Type of Group and Topic: Psychoeducational Group: Discharge Planning Participation Level: Did Not Attend  Description of Group Discharge planning group reviews patient's anticipated discharge plans and assists patients to anticipate and address any barriers to wellness/recovery in the community. Suicide prevention education is reviewed with patients in group. Therapeutic Goals 1. Patients will state their anticipated discharge plan and mental health aftercare 2. Patients will identify potential barriers to wellness in the community setting 3. Patients will engage in problem solving, solution focused discussion of ways to anticipate and address barriers to wellness/recovery   Summary of Patient Progress:  Invited, chose not to attend.   Plan for Discharge/Comments:  Invited, chose not to attend.     Therapeutic Modalities: Motivational Interviewing     Radonna Ricker, MSW, [place] Worker [facility]  Phone: [phone] 09/19/2019 3:45 PM

## 2019-09-19 NOTE — Progress Notes (Addendum)
[facility] MD Progress Note  09/19/2019 9:34 AM Eban Weick  MRN:  [id number] Subjective:   "I am feeling about the same. I didn't sleep well last night."  Lash Matulich is a 43 year old male, history of reported bipolar disorder, PTSD, cocaine use disorder.Presented to ED on 11/6 describing depression, suicidal ideations with thoughts of cutting himself, poor sleep, cocaine abuse.  Admission BAL negative, admission UDS positive for cocaine.  Home medications were listed as Depakote and Zyprexa.  On evaluation the patient is alert and oriented x 4, pleasant, and cooperative. Speech is clear and coherent, normal pace, normal volume. He rates depression as 8.5 out of 10 and anxiety as 8.5 out of 10 with 10 being the most severe. Affect appears depressed and vaguely anxious. Continues to endorse passive suicidal thoughts. Thought process is coherent. Denies AVH, no indication of delusional thought content. Denies SI at this time and states that he is able to contract for safety while in the hospital. States that he woke up multiple times during the night and estimates that he slept about 4 hours. Reports appetite as good.Denies any withdrawal symptoms. Denies tremors, headache, nausea, vomiting, diarrhea.   Principal Problem: Substance induced mood disorder (HCC) Diagnosis: Principal Problem:   Substance induced mood disorder (HCC) Active Problems:   Bipolar 1 disorder (HCC)   Cocaine dependence (HCC)   Alcohol dependence ([place])   Antisocial personality disorder ([place])  Total Time spent with patient: 15 minutes  Past Psychiatric History: See admission H&P  Past Medical History:  Past Medical History:  Diagnosis Date  . ADHD   . Asthma   . Bipolar 1 disorder ([place])    History reviewed. No pertinent surgical history. Family History: History reviewed. No pertinent family history. Family Psychiatric  History: See admission H&P Social History:  Social History   Substance and Sexual Activity   Alcohol Use Yes   Comment: occasional      Social History   Substance and Sexual Activity  Drug Use No   Comment: has not used cocain and ETOH for 29 days    Social History   Socioeconomic History  . Marital status: Legally Separated    Spouse name: Not on file  . Number of children: Not on file  . Years of education: Not on file  . Highest education level: Not on file  Occupational History  . Not on file  Social Needs  . Financial resource strain: Not on file  . Food insecurity    Worry: Not on file    Inability: Not on file  . Transportation needs    Medical: Not on file    Non-medical: Not on file  Tobacco Use  . Smoking status: Current Every Day Smoker    Packs/day: 0.50    Types: Cigarettes  . Smokeless tobacco: Never Used  Substance and Sexual Activity  . Alcohol use: Yes    Comment: occasional   . Drug use: No    Comment: has not used cocain and ETOH for 29 days  . Sexual activity: Not on file  Lifestyle  . Physical activity    Days per week: Not on file    Minutes per session: Not on file  . Stress: Not on file  Relationships  . Social Herbalist on phone: Not on file    Gets together: Not on file    Attends religious service: Not on file    Active member of club or organization: Not on file  Attends meetings of clubs or organizations: Not on file    Relationship status: Not on file  Other Topics Concern  . Not on file  Social History Narrative  . Not on file   Additional Social History:    Pain Medications: see MAR Prescriptions: See MAR Over the Counter: See MAR History of alcohol / drug use?: Yes Negative Consequences of Use: Financial, Work / Youth worker, Personal relationships Withdrawal Symptoms: Agitation, Irritability  Sleep: Fair/improving  Appetite:  Good  Current Medications: Current Facility-Administered Medications  Medication Dose Route Frequency Provider Last Rate Last Dose  . acetaminophen (TYLENOL) tablet 650 mg   650 mg Oral Q6H PRN [name], FNP      . albuterol (VENTOLIN HFA) 108 (90 Base) MCG/ACT inhaler 1-2 puff  1-2 puff Inhalation Q6H PRN [name], FNP      . alum & mag hydroxide-simeth (MAALOX/MYLANTA) 200-200-20 MG/5ML suspension 30 mL  30 mL Oral Q4H PRN [name], FNP      . divalproex (DEPAKOTE) DR tablet 500 mg  500 mg Oral Q12H [name], FNP   500 mg at 09/19/19 0804  . folic acid (FOLVITE) tablet 1 mg  1 mg Oral Daily [name], MD   1 mg at 09/17/19 0254  . LORazepam (ATIVAN) tablet 1 mg  1 mg Oral Q6H PRN [name], MD   1 mg at 09/17/19 1412  . magnesium hydroxide (MILK OF MAGNESIA) suspension 30 mL  30 mL Oral Daily PRN [name], FNP      . OLANZapine (ZYPREXA) tablet 10 mg  10 mg Oral Daily [name], MD   10 mg at 09/19/19 0803  . OLANZapine (ZYPREXA) tablet 15 mg  15 mg Oral QHS [name], MD   15 mg at 09/17/19 2234  . OLANZapine zydis (ZYPREXA) disintegrating tablet 10 mg  10 mg Oral Q8H PRN [name], MD   10 mg at 09/15/19 1545   And  . ziprasidone (GEODON) injection 20 mg  20 mg Intramuscular PRN [name], MD      . pantoprazole (PROTONIX) EC tablet 40 mg  40 mg Oral Daily [name], MD   40 mg at 09/17/19 2706  . ramelteon (ROZEREM) tablet 8 mg  8 mg Oral QHS [name], MD   8 mg at 09/17/19 2234  . thiamine (VITAMIN B-1) tablet 100 mg  100 mg Oral Daily [name], MD   100 mg at 09/17/19 2376  . traZODone (DESYREL) tablet 50 mg  50 mg Oral QHS PRN Cobos, Myer Peer, MD        Lab Results:  No results found for this or any previous visit (from the past 48 hour(s)).  Blood Alcohol level:  Lab Results  Component Value Date   ETH <10 09/14/2019   ETH <10 28/31/5176    Metabolic Disorder Labs: Lab Results  Component Value Date   HGBA1C 5.3 09/17/2019   MPG 105.41 09/17/2019   No results found for: PROLACTIN No results found for: CHOL, TRIG, HDL, CHOLHDL, VLDL,  LDLCALC  Physical Findings: AIMS: Facial and Oral Movements Muscles of Facial Expression: None, normal Lips and Perioral Area: None, normal Jaw: None, normal Tongue: None, normal,Extremity Movements Upper (arms, wrists, hands, fingers): None, normal Lower (legs, knees, ankles, toes): None, normal, Trunk Movements Neck, shoulders, hips: None, normal, Overall Severity Severity of abnormal movements (highest score from questions above): None, normal Incapacitation due  to abnormal movements: None, normal Patient's awareness of abnormal movements (rate only patient's report): No Awareness, Dental Status Current problems with teeth and/or dentures?: No Does patient usually wear dentures?: No  CIWA:  CIWA-Ar Total: 1 COWS:  COWS Total Score: 3  Musculoskeletal: Strength & Muscle Tone: within normal limits Gait & Station: normal Patient leans: N/A  Psychiatric Specialty Exam: Physical Exam  Nursing note and vitals reviewed. Constitutional: He is oriented to person, place, and time. He appears well-developed and well-nourished.  HENT:  Head: Normocephalic and atraumatic.  Respiratory: Effort normal.  Neurological: He is alert and oriented to person, place, and time.    ROS denies chest pain or shortness of breath, no dyspnea, no vomiting, no fever, no chills  Blood pressure 102/78, pulse (!) 102, temperature (!) 97.5 F (36.4 C), temperature source Oral, resp. rate 16, height '5\' 10"'$  (1.778 m), weight 86.6 kg, SpO2 100 %.Body mass index is 27.41 kg/m.  General Appearance: Fairly Groomed  Eye Contact:  Fair  Speech:  Normal Rate  Volume:  Normal  Mood:  Dysphoric, irritable  Affect:  Congruent  Thought Process:  Linear and Descriptions of Associations: Intact  Orientation:  Other:  alert and attentive  Thought Content:  Currently denies hallucinations and does not appear internally preoccupied.  No delusions are expressed  Suicidal Thoughts:  Yes.  without intent/plan contracts for  safety on unit and presents future oriented  Homicidal Thoughts:  No  Memory:  Recent and remote fair  Judgement:  Fair  Insight:  Fair  Psychomotor Activity:  Normal-currently in bed, appears calm/comfortable and in no acute distress  Concentration:  Concentration: Fair and Attention Span: Fair  Recall:  [employer] of Knowledge:  Fair  Language:  Fair  Akathisia:  Negative  Handed:  Right  AIMS (if indicated):     Assets:  Desire for Improvement Resilience  ADL's:  Intact  Cognition:  WNL  Sleep:  Number of Hours: 6.75     Treatment Plan Summary: Treatment plan reviewed as below today 11/11 Encourage group and milieu participation Encourage efforts to work on sobriety and relapse prevention Treatment team working on disposition planning options 1.  Continue Depakote DR 500 mg p.o. every 12 hours for mood disorder 2.  Continue folic acid 1 mg p.o. daily  3.  Continue Lorazepam 1 mg p.o. every 6 hours as needed a CIWA greater than 10. 4.  Continue Rozerem 8 mg p.o. nightly for insomnia 5.  Continue Zyprexa  10 mg p.o. daily and 15 mg p.o. nightly for mood disorder, psychosis and insomnia. 6.  Continue Protonix 40 mg p.o. daily for gastric protection. 7.  Continue thiamine /folate supplementation 8.  Decrease Trazodone to 50 mg nightly as needed for insomnia   Rozetta Nunnery, NP 09/19/2019, 9:34 AM   Patient ID: [name] Catholic, male   DOB: 04/25/1976, 43 y.o.   MRN: [id number]   I have reviewed the case with treatment team and have met patient along with Corene Cornea, NP.  Patient is a 43 year old male with a history of mood disorder,  cocaine use disorder.  Presented on November 6 with depression, suicidal thoughts of cutting self, neurovegetative symptoms/insomnia, cocaine abuse.  Home medications listed as Depakote and Zyprexa.  Patient remains vaguely dysphoric and irritable, although some improvement is noted today.  He has been out of bed more today ,although milieu  participation remains limited.  Eye contact is also improving .  Describes lingering depression and endorses  passive  suicidal thoughts, without plan or intention. Contracts for safety on unit, and presents future oriented at this time. Denies medication side effects , although Zyprexa may be contributing to some AM sedation.. Plan- Decrease Zyprexa to 20 mgrs QHS, continue Depakote 500 mgrs BID. Check Valproic Acid Serum level in AM  F Cobos, MD

## 2019-09-19 NOTE — Progress Notes (Signed)
Patient ID: Ronald Brooks, male   DOB: [birth year], 43 y.o.   MRN: [id number]   [place] NOVEL CORONAVIRUS (COVID-19) DAILY CHECK-OFF SYMPTOMS - answer yes or no to each - every day NO YES  Have you had a fever in the past 24 hours?  . Fever (Temp > 37.80C / 100F) X   Have you had any of these symptoms in the past 24 hours? . New Cough .  Sore Throat  .  Shortness of Breath .  Difficulty Breathing .  Unexplained Body Aches   X   Have you had any one of these symptoms in the past 24 hours not related to allergies?   . Runny Nose .  Nasal Congestion .  Sneezing   X   If you have had runny nose, nasal congestion, sneezing in the past 24 hours, has it worsened?  X   EXPOSURES - check yes or no X   Have you traveled outside the state in the past 14 days?  X   Have you been in contact with someone with a confirmed diagnosis of COVID-19 or PUI in the past 14 days without wearing appropriate PPE?  X   Have you been living in the same home as a person with confirmed diagnosis of COVID-19 or a PUI (household contact)?    X   Have you been diagnosed with COVID-19?    X              What to do next: Answered NO to all: Answered YES to anything:   Proceed with unit schedule Follow the [facility] Inpatient Flowsheet.

## 2019-09-19 NOTE — Plan of Care (Signed)
  Problem: Safety: Goal: Ability to remain free from injury will improve Outcome: Progressing   Problem: Activity: Goal: Interest or engagement in leisure activities will improve Outcome: Not Progressing

## 2019-09-19 NOTE — Progress Notes (Signed)
   09/19/19 0004  Psych Admission Type (Psych Patients Only)  Admission Status Voluntary  Psychosocial Assessment  Patient Complaints Irritability  Eye Contact Brief  Facial Expression Flat;Angry  Affect Irritable  Speech Logical/coherent  Interaction Minimal  Motor Activity Other (Comment) (WNL)  Appearance/Hygiene Unremarkable  Behavior Characteristics Unwilling to participate  Mood Labile  Thought Process  Coherency WDL  Content WDL  Delusions None reported or observed  Perception WDL  Hallucination None reported or observed  Judgment WDL  Confusion None  Danger to Self  Current suicidal ideation? Denies  Self-Injurious Behavior No self-injurious ideation or behavior indicators observed or expressed   Agreement Not to Harm Self Yes  Description of Agreement verbally contracts for safety  Danger to Others  Danger to Others None reported or observed  D: Attempted multiple time to wake up for snack and medication but pt refused.   A: Support and encouragement provided as needed.  R: Patient remains safe on the unit. Will continue to monitor for safety and stability.

## 2019-09-20 LAB — VALPROIC ACID LEVEL: Valproic Acid Lvl: 29 ug/mL — ABNORMAL LOW (ref 50.0–100.0)

## 2019-09-20 MED ORDER — NICOTINE 21 MG/24HR TD PT24
21.0000 mg | MEDICATED_PATCH | Freq: Every day | TRANSDERMAL | Status: DC
  Administered 2019-09-20 – 2019-09-21 (×2): 21 mg via TRANSDERMAL
  Filled 2019-09-20 (×4): qty 1

## 2019-09-20 MED ORDER — NICOTINE 21 MG/24HR TD PT24
MEDICATED_PATCH | TRANSDERMAL | Status: AC
  Filled 2019-09-20: qty 1

## 2019-09-20 NOTE — Progress Notes (Addendum)
[facility] MD Progress Note  09/20/2019 1:30 PM Natalio Salois  MRN:  [id number] Subjective: Patient reports feeling "the same".  Today he does present more future oriented and spoke about discharge plans.  He reports his goal is to go back to " [facility] sober/residential setting he had been staying in prior to admission.  He states he needs to speak with [facility] psychotherapist to make sure he is being allowed back.  He worries/ruminates that this person will have access to his chart.  I reviewed chart confidentiality and reassured him that information is only shared with his expressed consent.  He appeared reassured by this.  Acknowledges that his concern is that "I do not want him to know that I used cocaine".  Objective : I discussed case with treatment team and met with patient 43 year old male, history of reported bipolar disorder, PTSD, cocaine use disorder. Presented to ED on 11/6 describing depression, suicidal ideations with thoughts of cutting himself, poor sleep, cocaine abuse.  Admission BAL negative, admission UDS positive for cocaine.  Home medications were listed as Depakote and Zyprexa.  Today patient remains vaguely dysphoric, constricted in affect, appears less irritable today.  Describes lingering depression.  Denies suicidal ideations at this time.  Denies hallucinations and does not appear internally preoccupied. As above, presents more future oriented today, starting to focus more on disposition planning options. We reviewed history further.  He acknowledges that mood tends to worsen with active substance abuse but states that "I am still bipolar even when I am not using", and describes a history of mood instability.  States he has been on Depakote and Zyprexa for "years".  Currently denies medication side effects. As reviewed with staff milieu participation remains limited.  Patient remains isolative and relatively minimal/superficially cooperative on approach.  No overtly  disruptive or agitated behaviors.  Today was noted to be visible on unit hallway.   Principal Problem: Substance induced mood disorder (HCC) Diagnosis: Principal Problem:   Substance induced mood disorder (HCC) Active Problems:   Bipolar 1 disorder (HCC)   Cocaine dependence (HCC)   Alcohol dependence ([place])   Antisocial personality disorder ([place])  Total Time spent with patient: 15 minutes  Past Psychiatric History: See admission H&P  Past Medical History:  Past Medical History:  Diagnosis Date  . ADHD   . Asthma   . Bipolar 1 disorder ([place])    History reviewed. No pertinent surgical history. Family History: History reviewed. No pertinent family history. Family Psychiatric  History: See admission H&P Social History:  Social History   Substance and Sexual Activity  Alcohol Use Yes   Comment: occasional      Social History   Substance and Sexual Activity  Drug Use No   Comment: has not used cocain and ETOH for 29 days    Social History   Socioeconomic History  . Marital status: Legally Separated    Spouse name: Not on file  . Number of children: Not on file  . Years of education: Not on file  . Highest education level: Not on file  Occupational History  . Not on file  Social Needs  . Financial resource strain: Not on file  . Food insecurity    Worry: Not on file    Inability: Not on file  . Transportation needs    Medical: Not on file    Non-medical: Not on file  Tobacco Use  . Smoking status: Current Every Day Smoker    Packs/day: 0.50  Types: Cigarettes  . Smokeless tobacco: Never Used  Substance and Sexual Activity  . Alcohol use: Yes    Comment: occasional   . Drug use: No    Comment: has not used cocain and ETOH for 29 days  . Sexual activity: Not on file  Lifestyle  . Physical activity    Days per week: Not on file    Minutes per session: Not on file  . Stress: Not on file  Relationships  . Social Herbalist on phone: Not on file     Gets together: Not on file    Attends religious service: Not on file    Active member of club or organization: Not on file    Attends meetings of clubs or organizations: Not on file    Relationship status: Not on file  Other Topics Concern  . Not on file  Social History Narrative  . Not on file   Additional Social History:    Pain Medications: see MAR Prescriptions: See MAR Over the Counter: See MAR History of alcohol / drug use?: Yes Negative Consequences of Use: Financial, Work / Youth worker, Personal relationships Withdrawal Symptoms: Agitation, Irritability  Sleep: Fair/improving  Appetite:  Good  Current Medications: Current Facility-Administered Medications  Medication Dose Route Frequency Provider Last Rate Last Dose  . acetaminophen (TYLENOL) tablet 650 mg  650 mg Oral Q6H PRN [name], FNP      . albuterol (VENTOLIN HFA) 108 (90 Base) MCG/ACT inhaler 1-2 puff  1-2 puff Inhalation Q6H PRN [name], FNP      . alum & mag hydroxide-simeth (MAALOX/MYLANTA) 200-200-20 MG/5ML suspension 30 mL  30 mL Oral Q4H PRN [name], FNP      . divalproex (DEPAKOTE) DR tablet 500 mg  500 mg Oral Q12H [name], FNP   500 mg at 09/20/19 0849  . folic acid (FOLVITE) tablet 1 mg  1 mg Oral Daily [name], MD   1 mg at 09/20/19 0849  . LORazepam (ATIVAN) tablet 1 mg  1 mg Oral Q6H PRN [name], MD   1 mg at 09/19/19 1550  . magnesium hydroxide (MILK OF MAGNESIA) suspension 30 mL  30 mL Oral Daily PRN [name], FNP      . nicotine (NICODERM CQ - dosed in mg/24 hours) 21 mg/24hr patch           . nicotine (NICODERM CQ - dosed in mg/24 hours) patch 21 mg  21 mg Transdermal Daily Kayvion Arneson, Myer Peer, MD   21 mg at 09/20/19 1220  . OLANZapine (ZYPREXA) tablet 20 mg  20 mg Oral QHS Hazely Sealey A, MD      . OLANZapine zydis (ZYPREXA) disintegrating tablet 10 mg  10 mg Oral Q8H PRN [name], MD   10 mg at 09/19/19 1831   And  . ziprasidone (GEODON)  injection 20 mg  20 mg Intramuscular PRN [name], MD      . pantoprazole (PROTONIX) EC tablet 40 mg  40 mg Oral Daily [name], MD   40 mg at 09/20/19 0849  . ramelteon (ROZEREM) tablet 8 mg  8 mg Oral QHS [name], MD   8 mg at 09/17/19 2234  . thiamine (VITAMIN B-1) tablet 100 mg  100 mg Oral Daily [name], MD   100 mg at 09/20/19 0850  . traZODone (DESYREL) tablet 50 mg  50 mg Oral QHS PRN  Jamian Andujo, Myer Peer, MD        Lab Results:  No results found for this or any previous visit (from the past 48 hour(s)).  Blood Alcohol level:  Lab Results  Component Value Date   ETH <10 09/14/2019   ETH <10 62/83/1517    Metabolic Disorder Labs: Lab Results  Component Value Date   HGBA1C 5.3 09/17/2019   MPG 105.41 09/17/2019   No results found for: PROLACTIN No results found for: CHOL, TRIG, HDL, CHOLHDL, VLDL, LDLCALC  Physical Findings: AIMS: Facial and Oral Movements Muscles of Facial Expression: None, normal Lips and Perioral Area: None, normal Jaw: None, normal Tongue: None, normal,Extremity Movements Upper (arms, wrists, hands, fingers): None, normal Lower (legs, knees, ankles, toes): None, normal, Trunk Movements Neck, shoulders, hips: None, normal, Overall Severity Severity of abnormal movements (highest score from questions above): None, normal Incapacitation due to abnormal movements: None, normal Patient's awareness of abnormal movements (rate only patient's report): No Awareness, Dental Status Current problems with teeth and/or dentures?: No Does patient usually wear dentures?: No  CIWA:  CIWA-Ar Total: 1 COWS:  COWS Total Score: 3  Musculoskeletal: Strength & Muscle Tone: within normal limits Gait & Station: normal Patient leans: N/A  Psychiatric Specialty Exam: Physical Exam  Nursing note and vitals reviewed. Constitutional: He is oriented to person, place, and time. He appears well-developed and well-nourished.  HENT:   Head: Normocephalic and atraumatic.  Respiratory: Effort normal.  Neurological: He is alert and oriented to person, place, and time.    ROS denies chest pain or shortness of breath, no dyspnea, no vomiting, no fever, no chills  Blood pressure 102/78, pulse (!) 102, temperature (!) 97.5 F (36.4 C), temperature source Oral, resp. rate 16, height '5\' 10"'$  (1.778 m), weight 86.6 kg, SpO2 100 %.Body mass index is 27.41 kg/m.  General Appearance: Fairly Groomed  Eye Contact:  Fair/improving  Speech:  Normal Rate  Volume:  Normal  Mood:  Describes lingering depression, remains vaguely dysphoric  Affect:  Congruent, less irritable today  Thought Process:  Linear and Descriptions of Associations: Intact  Orientation:  Other:  alert and attentive  Thought Content:  Currently denies hallucinations and does not appear internally preoccupied.  No delusions are expressed  Suicidal Thoughts: No SI today, contracts for safety on unit at this time  Homicidal Thoughts:  No  Memory:  Recent and remote fair  Judgement:  Fair  Insight:  Fair  Psychomotor Activity: Decreased-currently in bed, appears calm/comfortable and in no acute distress  Concentration:  Concentration: Fair and Attention Span: Fair  Recall:  [employer] of Knowledge:  Fair  Language:  Fair  Akathisia:  Negative  Handed:  Right  AIMS (if indicated):     Assets:  Desire for Improvement Resilience  ADL's:  Intact  Cognition:  WNL  Sleep:  Number of Hours: 3   Assessment: 43 year old male, history of reported bipolar disorder, PTSD, cocaine use disorder. Presented to ED on 11/6 describing depression, suicidal ideations with thoughts of cutting himself, poor sleep, cocaine abuse.  Admission BAL negative, admission UDS positive for cocaine.  Home medications were listed as Depakote and Zyprexa.  Patient remains dysphoric and vaguely irritable although less so today.  Remains isolative in room with limited group/milieu interaction.   We spoke about this.  States "I just prefer to be alone, I do not like people that much".  Currently he is starting to focus more on discharge and his stated goal is to return  to [facility] (sober/residential setting).  States he plans to speak with " T " ( [facility] administrator) today. Denies medication side effects.  Requesting NicoDerm patch for cigarette/nicotine cravings.  Treatment Plan Summary: Treatment plan reviewed as below today 11/12 Encourage group and milieu participation Encourage efforts to work on sobriety and relapse prevention Treatment team working on disposition planning options- see above 1.  Continue Depakote DR 500 mg p.o. every 12 hours for mood disorder 2.  Continue folic acid 1 mg p.o. daily  3.  Continue Lorazepam 1 mg p.o. every 6 hours as needed a CIWA greater than 10. 4.  Continue Rozerem 8 mg p.o. nightly for insomnia 5.  Continue Zyprexa  20 mg p.o. nightly for mood disorder, psychosis and insomnia. 6.  Continue Protonix 40 mg p.o. daily for gastric protection. 7.  Continue thiamine /folate supplementation 8.  Continue  Trazodone 50 mg nightly as needed for insomnia 9.  Start NicoDerm patch for nicotine cravings 10.  Check Valproic Acid Serum level    Jenne Campus, MD 09/20/2019, 1:30 PM   Patient ID: Reginold Beale, male   DOB: 18-Nov-1975, 43 y.o.   MRN: [id number]

## 2019-09-20 NOTE — Progress Notes (Signed)
CSW met with patient at bedside to discuss discharge plans. Patient was living in a halfway house ([place]) prior to admission. Patient reports he told Sunny Schlein, Teaching laboratory technician, that he relapsed and this may compromise his ability to return to the halfway house.  CSW reminded patient that the halfway house does not have access to patient medical records. CSW encouraged patient to call Sunny Schlein to confirm discharge plans, as he is expected to discharge tomorrow.  CSW offered to provide patient with [employer] for [place] as a back up, patient declines. CSW provided patient with shelter resource lists and encouraged patient to call [facility]. CSW explained that [facility] works to [place] beds, but patient would need to call today. Patient voiced understanding and stated he had no further questions.  Stephanie Acre, LCSW-A Clinical Social Worker

## 2019-09-20 NOTE — Progress Notes (Signed)
   09/20/19 0000  Psych Admission Type (Psych Patients Only)  Admission Status Voluntary  Psychosocial Assessment  Patient Complaints None  Eye Contact Brief  Facial Expression Flat  Affect Irritable;Flat  Speech Logical/coherent  Interaction Isolative;Minimal  Motor Activity Other (Comment)  Appearance/Hygiene Disheveled  Behavior Characteristics Unwilling to participate  Mood Irritable  Thought Process  Coherency WDL  Content WDL  Delusions None reported or observed  Perception WDL  Hallucination None reported or observed  Judgment Impaired  Confusion None  Danger to Self  Current suicidal ideation? Denies  Self-Injurious Behavior No self-injurious ideation or behavior indicators observed or expressed   Danger to Others  Danger to Others None reported or observed   Pt. Irritable and minimal, he did not attend wrap up group.

## 2019-09-20 NOTE — Progress Notes (Signed)
DAR NOTE:  D-Pt endorses passive SI.  Pt Said he thinks about hurting himself by "cutting my wrists."   Pt said "I'm safer in here (at the hospital) than I am outside of here."  A-Empathic listening provided, emotional support given.  Administered pt his medications that pt's physician prescribed pt. Maintained q15 min safety checks.  R-Pt was irritable and annoyed when speaking to this Probation officer and other staff members.  Pt seem to calm down some after getting nicorette gum.  Pt refused to fill out patient self-inventory worksheet this morning.   Pt responds to questions posed to him with 1 to 3 words.   Pt remains safe on the unit.  RN will continue to monitor.

## 2019-09-20 NOTE — Progress Notes (Signed)
Adult Psychoeducational Group Note  Date:  09/20/2019 Time:  10:47 AM  Group Topic/Focus:  Crisis Planning:   The purpose of this group is to help patients create a crisis plan for use upon discharge or in the future, as needed.  Participation Level:  Did Not Attend   Pt was informed the group with the MHT will be starting in the dayroom. Pt chose not to attend.   Lita Mains 09/20/2019, 10:47 AM

## 2019-09-20 NOTE — Progress Notes (Signed)
Dar Note: Patient presents with irritable affect and mood.  Patient visible in milieu interacting with peers.  Denies suicidal thoughts, auditory and visual hallucinations.  Medication given as prescribed.    Routine safety checks maintained every 15 minutes.  Patient is safe on the unit.

## 2019-09-21 MED ORDER — DIVALPROEX SODIUM 500 MG PO DR TAB
1000.0000 mg | DELAYED_RELEASE_TABLET | Freq: Every day | ORAL | Status: DC
  Administered 2019-09-21: 1000 mg via ORAL
  Filled 2019-09-21: qty 14
  Filled 2019-09-21: qty 2
  Filled 2019-09-21: qty 14
  Filled 2019-09-21: qty 2

## 2019-09-21 MED ORDER — DIVALPROEX SODIUM 500 MG PO DR TAB
500.0000 mg | DELAYED_RELEASE_TABLET | ORAL | Status: DC
  Administered 2019-09-22: 500 mg via ORAL
  Filled 2019-09-21 (×3): qty 1

## 2019-09-21 NOTE — Plan of Care (Signed)
Nurse discussed anxiety, depression and coping skills with patient.  

## 2019-09-21 NOTE — Progress Notes (Signed)
Patient ID: Ronald Brooks, male   DOB: [birth year], 43 y.o.   MRN: [id number] D: Assumed care patient @ 2330. Patient in bed sleeping. Respiration regular and unlabored. No sign of distress noted at this time A: 15 mins checks for safety. R: Patient remains safe.

## 2019-09-21 NOTE — Tx Team (Signed)
Interdisciplinary Treatment and Diagnostic Plan Update  09/21/2019 Time of Session: 9:00am Ronald BreedingJonathon Brooks MRN: [id number]  Principal Diagnosis: Substance induced mood disorder (HCC)  Secondary Diagnoses: Principal Problem:   Substance induced mood disorder (HCC) Active Problems:   Bipolar 1 disorder (HCC)   Cocaine dependence (HCC)   Alcohol dependence (HCC)   Antisocial personality disorder (HCC)   Current Medications:  Current Facility-Administered Medications  Medication Dose Route Frequency Provider Last Rate Last Dose  . acetaminophen (TYLENOL) tablet 650 mg  650 mg Oral Q6H PRN Patrcia Dollyate, Tina L, FNP      . albuterol (VENTOLIN HFA) 108 (90 Base) MCG/ACT inhaler 1-2 puff  1-2 puff Inhalation Q6H PRN Patrcia Dollyate, Tina L, FNP      . alum & mag hydroxide-simeth (MAALOX/MYLANTA) 200-200-20 MG/5ML suspension 30 mL  30 mL Oral Q4H PRN Patrcia Dollyate, Tina L, FNP      . divalproex (DEPAKOTE) DR tablet 500 mg  500 mg Oral Q12H Patrcia Dollyate, Tina L, FNP   500 mg at 09/21/19 0817  . folic acid (FOLVITE) tablet 1 mg  1 mg Oral Daily Antonieta Pertlary, Greg Lawson, MD   1 mg at 09/21/19 0817  . LORazepam (ATIVAN) tablet 1 mg  1 mg Oral Q6H PRN Antonieta Pertlary, Greg Lawson, MD   1 mg at 09/20/19 1806  . magnesium hydroxide (MILK OF MAGNESIA) suspension 30 mL  30 mL Oral Daily PRN Patrcia Dollyate, Tina L, FNP      . nicotine (NICODERM CQ - dosed in mg/24 hours) patch 21 mg  21 mg Transdermal Daily Cobos, Rockey SituFernando A, MD   21 mg at 09/21/19 0818  . OLANZapine (ZYPREXA) tablet 20 mg  20 mg Oral QHS Cobos, Rockey SituFernando A, MD   20 mg at 09/20/19 2116  . OLANZapine zydis (ZYPREXA) disintegrating tablet 10 mg  10 mg Oral Q8H PRN Antonieta Pertlary, Greg Lawson, MD   10 mg at 09/19/19 1831   And  . ziprasidone (GEODON) injection 20 mg  20 mg Intramuscular PRN Antonieta Pertlary, Greg Lawson, MD      . pantoprazole (PROTONIX) EC tablet 40 mg  40 mg Oral Daily Antonieta Pertlary, Greg Lawson, MD   40 mg at 09/21/19 0817  . ramelteon (ROZEREM) tablet 8 mg  8 mg Oral QHS Antonieta Pertlary, Greg Lawson, MD   8 mg at  09/20/19 2116  . thiamine (VITAMIN B-1) tablet 100 mg  100 mg Oral Daily Antonieta Pertlary, Greg Lawson, MD   100 mg at 09/21/19 0818  . traZODone (DESYREL) tablet 50 mg  50 mg Oral QHS PRN Cobos, Rockey SituFernando A, MD       PTA Medications: Medications Prior to Admission  Medication Sig Dispense Refill Last Dose  . albuterol (VENTOLIN HFA) 108 (90 Base) MCG/ACT inhaler Inhale 1-2 puffs into the lungs every 6 (six) hours as needed for wheezing or shortness of breath.     . divalproex (DEPAKOTE) 500 MG DR tablet Take 1 tablet (500 mg total) by mouth every 12 (twelve) hours. 10 tablet 0   . OLANZapine (ZYPREXA) 10 MG tablet Take 1 tablet (10 mg total) by mouth 2 (two) times daily. 10 tablet 0     Patient Stressors: Medication change or noncompliance Occupational concerns Substance abuse  Patient Strengths: Work Social workerskills  Treatment Modalities: Medication Management, Group therapy, Case management,  1 to 1 session with clinician, Psychoeducation, Recreational therapy.   Physician Treatment Plan for Primary Diagnosis: Substance induced mood disorder (HCC) Long Term Goal(s): Improvement in symptoms so as ready for discharge Improvement in symptoms so as  ready for discharge   Short Term Goals: Ability to identify changes in lifestyle to reduce recurrence of condition will improve Ability to verbalize feelings will improve Ability to disclose and discuss suicidal ideas Ability to demonstrate self-control will improve Ability to identify and develop effective coping behaviors will improve Ability to maintain clinical measurements within normal limits will improve Compliance with prescribed medications will improve Ability to identify triggers associated with substance abuse/mental health issues will improve Ability to identify changes in lifestyle to reduce recurrence of condition will improve Ability to verbalize feelings will improve Ability to disclose and discuss suicidal ideas Ability to demonstrate  self-control will improve Ability to identify and develop effective coping behaviors will improve Ability to maintain clinical measurements within normal limits will improve Compliance with prescribed medications will improve Ability to identify triggers associated with substance abuse/mental health issues will improve  Medication Management: Evaluate patient's response, side effects, and tolerance of medication regimen.  Therapeutic Interventions: 1 to 1 sessions, Unit Group sessions and Medication administration.  Evaluation of Outcomes: Adequate for Discharge  Physician Treatment Plan for Secondary Diagnosis: Principal Problem:   Substance induced mood disorder (HCC) Active Problems:   Bipolar 1 disorder (HCC)   Cocaine dependence (HCC)   Alcohol dependence (HCC)   Antisocial personality disorder (HCC)  Long Term Goal(s): Improvement in symptoms so as ready for discharge Improvement in symptoms so as ready for discharge   Short Term Goals: Ability to identify changes in lifestyle to reduce recurrence of condition will improve Ability to verbalize feelings will improve Ability to disclose and discuss suicidal ideas Ability to demonstrate self-control will improve Ability to identify and develop effective coping behaviors will improve Ability to maintain clinical measurements within normal limits will improve Compliance with prescribed medications will improve Ability to identify triggers associated with substance abuse/mental health issues will improve Ability to identify changes in lifestyle to reduce recurrence of condition will improve Ability to verbalize feelings will improve Ability to disclose and discuss suicidal ideas Ability to demonstrate self-control will improve Ability to identify and develop effective coping behaviors will improve Ability to maintain clinical measurements within normal limits will improve Compliance with prescribed medications will  improve Ability to identify triggers associated with substance abuse/mental health issues will improve     Medication Management: Evaluate patient's response, side effects, and tolerance of medication regimen.  Therapeutic Interventions: 1 to 1 sessions, Unit Group sessions and Medication administration.  Evaluation of Outcomes: Adequate for Discharge   RN Treatment Plan for Primary Diagnosis: Substance induced mood disorder (HCC) Long Term Goal(s): Knowledge of disease and therapeutic regimen to maintain health will improve  Short Term Goals: Ability to verbalize feelings will improve, Ability to identify and develop effective coping behaviors will improve and Compliance with prescribed medications will improve  Medication Management: RN will administer medications as ordered by provider, will assess and evaluate patient's response and provide education to patient for prescribed medication. RN will report any adverse and/or side effects to prescribing provider.  Therapeutic Interventions: 1 on 1 counseling sessions, Psychoeducation, Medication administration, Evaluate responses to treatment, Monitor vital signs and CBGs as ordered, Perform/monitor CIWA, COWS, AIMS and Fall Risk screenings as ordered, Perform wound care treatments as ordered.  Evaluation of Outcomes: Adequate for Discharge   LCSW Treatment Plan for Primary Diagnosis: Substance induced mood disorder (HCC) Long Term Goal(s): Safe transition to appropriate next level of care at discharge, Engage patient in therapeutic group addressing interpersonal concerns.  Short Term Goals: Engage patient in  aftercare planning with referrals and resources, Increase social support, Increase emotional regulation, Identify triggers associated with mental health/substance abuse issues and Increase skills for wellness and recovery  Therapeutic Interventions: Assess for all discharge needs, 1 to 1 time with Social worker, Explore available  resources and support systems, Assess for adequacy in community support network, Educate family and significant other(s) on suicide prevention, Complete Psychosocial Assessment, Interpersonal group therapy.  Evaluation of Outcomes: Adequate for Discharge   Progress in Treatment: Attending groups: No. Participating in groups: No. Taking medication as prescribed: Yes. Toleration medication: Yes. Family/Significant other contact made: No, will contact:  supports if consents are granted. Patient understands diagnosis: Yes. Discussing patient identified problems/goals with staff: No. Medical problems stabilized or resolved: Yes. Denies suicidal/homicidal ideation: Yes. Issues/concerns per patient self-inventory: No.  New problem(s) identified: No, Describe:  CSW continuing to assess  New Short Term/Long Term Goal(s): detox, medication management for mood stabilization; elimination of SI thoughts; development of comprehensive mental wellness/sobriety plan.  Patient Goals:  "Try to get meds and not feel suicidal."  Discharge Plan or Barriers: Patient plans to return to [facility] at discharge. He will [facility] with [facility] for outpatient medication management and therapy services at discharge.   Reason for Continuation of Hospitalization: Depression Medication stabilization  Estimated Length of Stay: 1-2 days  Attendees: Patient: Ronald Brooks 09/21/2019 9:59 AM  Physician: Queen Blossom; Dr. Neita Garnet, MD 09/21/2019 9:59 AM  Nursing: Rise Paganini, RN 09/21/2019 9:59 AM  RN Care Manager: 09/21/2019 9:59 AM  Social Worker: Stephanie Acre, LCSWA; [place], [place] 09/21/2019 9:59 AM  Recreational Therapist:  09/21/2019 9:59 AM  Other: Harriett Sine, NP  09/21/2019 9:59 AM  Other:  09/21/2019 9:59 AM  Other: 09/21/2019 9:59 AM    Scribe for Treatment Team: Marylee Floras, [place] 09/21/2019 9:59 AM

## 2019-09-21 NOTE — Progress Notes (Signed)
[facility] MD Progress Note  09/21/2019 11:44 AM Ronald Brooks  MRN:  [id number] Subjective: Patient reports feeling "the same".  Denies medication side effects.  Today denies suicidal ideations.  Objective : I discussed case with treatment team and met with patient 43 year old male, history of reported bipolar disorder, PTSD, cocaine use disorder. Presented to ED on 11/6 describing depression, suicidal ideations with thoughts of cutting himself, poor sleep, cocaine abuse.  Admission BAL negative, admission UDS positive for cocaine.  Home medications were listed as Depakote and Zyprexa.  Today patient presents alert, attentive, calm without psychomotor agitation.  Remains dysphoric and vaguely irritable but behaviors in good control without any agitated or disruptive behaviors.  Since admission he is milieu participation has been limited, which he states is in part related to preferring to be alone, "not liking people".  We have reviewed this, patient describes some social anxiety type symptoms but mainly describes a vague sense of irritability and hypervigilance around others. As above, denies suicidal ideations and is currently future oriented.  He states that he spoke with " T", the manager at " [facility]" (the sober residential setting patient had been residing in).  States he has been tentatively accepted to go back.  He states he admitted he had relapsed on cocaine.  He had been fearful that if he did so he would not be allowed to return but expresses some relief that apparently he will be able to go back to [facility]. Valproic acid level is subtherapeutic at 29. Denies medication side effects at this time.  Principal Problem: Substance induced mood disorder (HCC) Diagnosis: Principal Problem:   Substance induced mood disorder (HCC) Active Problems:   Bipolar 1 disorder (HCC)   Cocaine dependence (HCC)   Alcohol dependence ([place])   Antisocial personality disorder ([place])  Total Time  spent with patient: 15 minutes  Past Psychiatric History: See admission H&P  Past Medical History:  Past Medical History:  Diagnosis Date  . ADHD   . Asthma   . Bipolar 1 disorder ([place])    History reviewed. No pertinent surgical history. Family History: History reviewed. No pertinent family history. Family Psychiatric  History: See admission H&P Social History:  Social History   Substance and Sexual Activity  Alcohol Use Yes   Comment: occasional      Social History   Substance and Sexual Activity  Drug Use No   Comment: has not used cocain and ETOH for 29 days    Social History   Socioeconomic History  . Marital status: Legally Separated    Spouse name: Not on file  . Number of children: Not on file  . Years of education: Not on file  . Highest education level: Not on file  Occupational History  . Not on file  Social Needs  . Financial resource strain: Not on file  . Food insecurity    Worry: Not on file    Inability: Not on file  . Transportation needs    Medical: Not on file    Non-medical: Not on file  Tobacco Use  . Smoking status: Current Every Day Smoker    Packs/day: 0.50    Types: Cigarettes  . Smokeless tobacco: Never Used  Substance and Sexual Activity  . Alcohol use: Yes    Comment: occasional   . Drug use: No    Comment: has not used cocain and ETOH for 29 days  . Sexual activity: Not on file  Lifestyle  . Physical activity  Days per week: Not on file    Minutes per session: Not on file  . Stress: Not on file  Relationships  . Social Herbalist on phone: Not on file    Gets together: Not on file    Attends religious service: Not on file    Active member of club or organization: Not on file    Attends meetings of clubs or organizations: Not on file    Relationship status: Not on file  Other Topics Concern  . Not on file  Social History Narrative  . Not on file   Additional Social History:    Pain Medications: see  MAR Prescriptions: See MAR Over the Counter: See MAR History of alcohol / drug use?: Yes Negative Consequences of Use: Financial, Work / Youth worker, Personal relationships Withdrawal Symptoms: Agitation, Irritability  Sleep: Fair/improving  Appetite:  Good  Current Medications: Current Facility-Administered Medications  Medication Dose Route Frequency Provider Last Rate Last Dose  . acetaminophen (TYLENOL) tablet 650 mg  650 mg Oral Q6H PRN [name], FNP      . albuterol (VENTOLIN HFA) 108 (90 Base) MCG/ACT inhaler 1-2 puff  1-2 puff Inhalation Q6H PRN [name], FNP      . alum & mag hydroxide-simeth (MAALOX/MYLANTA) 200-200-20 MG/5ML suspension 30 mL  30 mL Oral Q4H PRN [name], FNP      . divalproex (DEPAKOTE) DR tablet 500 mg  500 mg Oral Q12H [name], FNP   500 mg at 09/21/19 0817  . folic acid (FOLVITE) tablet 1 mg  1 mg Oral Daily [name], MD   1 mg at 09/21/19 0817  . LORazepam (ATIVAN) tablet 1 mg  1 mg Oral Q6H PRN [name], MD   1 mg at 09/20/19 1806  . magnesium hydroxide (MILK OF MAGNESIA) suspension 30 mL  30 mL Oral Daily PRN [name], FNP      . nicotine (NICODERM CQ - dosed in mg/24 hours) patch 21 mg  21 mg Transdermal Daily Cobos, Myer Peer, MD   21 mg at 09/21/19 0818  . OLANZapine (ZYPREXA) tablet 20 mg  20 mg Oral QHS Cobos, Myer Peer, MD   20 mg at 09/20/19 2116  . OLANZapine zydis (ZYPREXA) disintegrating tablet 10 mg  10 mg Oral Q8H PRN [name], MD   10 mg at 09/19/19 1831   And  . ziprasidone (GEODON) injection 20 mg  20 mg Intramuscular PRN [name], MD      . pantoprazole (PROTONIX) EC tablet 40 mg  40 mg Oral Daily [name], MD   40 mg at 09/21/19 0817  . ramelteon (ROZEREM) tablet 8 mg  8 mg Oral QHS [name], MD   8 mg at 09/20/19 2116  . thiamine (VITAMIN B-1) tablet 100 mg  100 mg Oral Daily [name], MD   100 mg at 09/21/19 0818  . traZODone (DESYREL) tablet 50 mg   50 mg Oral QHS PRN Cobos, Myer Peer, MD        Lab Results:  Results for orders placed or performed during the hospital encounter of 09/15/19 (from the past 48 hour(s))  Valproic acid level     Status: Abnormal   Collection Time: 09/20/19  6:42 PM  Result Value Ref Range   Valproic Acid Lvl 29 (L) 50.0 - 100.0 ug/mL    Comment: Performed at [facility], [place]  [address], [place], [place] [address]    Blood Alcohol level:  Lab Results  Component Value Date   [facility] <10 09/14/2019   ETH <10 83/38/2505    Metabolic Disorder Labs: Lab Results  Component Value Date   HGBA1C 5.3 09/17/2019   MPG 105.41 09/17/2019   No results found for: PROLACTIN No results found for: CHOL, TRIG, HDL, CHOLHDL, VLDL, LDLCALC  Physical Findings: AIMS: Facial and Oral Movements Muscles of Facial Expression: None, normal Lips and Perioral Area: None, normal Jaw: None, normal Tongue: None, normal,Extremity Movements Upper (arms, wrists, hands, fingers): None, normal Lower (legs, knees, ankles, toes): None, normal, Trunk Movements Neck, shoulders, hips: None, normal, Overall Severity Severity of abnormal movements (highest score from questions above): None, normal Incapacitation due to abnormal movements: None, normal Patient's awareness of abnormal movements (rate only patient's report): No Awareness, Dental Status Current problems with teeth and/or dentures?: No Does patient usually wear dentures?: No  CIWA:  CIWA-Ar Total: 1 COWS:  COWS Total Score: 3  Musculoskeletal: Strength & Muscle Tone: within normal limits Gait & Station: normal Patient leans: N/A  Psychiatric Specialty Exam: Physical Exam  Nursing note and vitals reviewed. Constitutional: He is oriented to person, place, and time. He appears well-developed and well-nourished.  HENT:  Head: Normocephalic and atraumatic.  Respiratory: Effort normal.  Neurological: He is alert and oriented to person, place, and  time.    ROS denies chest pain or shortness of breath, no dyspnea, no vomiting, no fever, no chills  Blood pressure 117/83, pulse (!) 102, temperature (!) 97.5 F (36.4 C), temperature source Oral, resp. rate 16, height _0  (1.778 m), weight 86.6 kg, SpO2 100 %.Body mass index is 27.41 kg/m.  General Appearance: Fairly Groomed  Eye Contact:  Fair/improving  Speech:  Normal Rate  Volume:  Normal  Mood: Remains vaguely dysphoric, some improvement compared to admission  Affect:  Congruent, although dysphoric appears less significantly irritable  Thought Process:  Linear and Descriptions of Associations: Intact  Orientation:  Other:  alert and attentive  Thought Content:  Currently denies hallucinations and does not appear internally preoccupied.  No delusions are expressed  Suicidal Thoughts: No SI today, contracts for safety on unit at this time  Homicidal Thoughts:  No  Memory:  Recent and remote fair  Judgement:  Fair  Insight:  Fair  Psychomotor Activity: Decreased-currently in bed, appears calm/comfortable and in no acute distress  Concentration:  Concentration: Fair and Attention Span: Fair-but noticed to be improving, and presents better able to collaborate/cooperate with session  Recall:  [employer] of Knowledge:  Fair  Language:  Fair  Akathisia:  Negative  Handed:  Right  AIMS (if indicated):     Assets:  Desire for Improvement Resilience  ADL's:  Intact  Cognition:  WNL  Sleep:  Number of Hours: 6   Assessment: 43 year old male, history of reported bipolar disorder, PTSD, cocaine use disorder. Presented to ED on 11/6 describing depression, suicidal ideations with thoughts of cutting himself, poor sleep, cocaine abuse.  Admission BAL negative, admission UDS positive for cocaine.  Home medications were listed as Depakote and Zyprexa.  Patient remains vaguely dysphoric/irritable/isolative.  He does present less significantly irritable than he did on admission.  Today  denies SI and presents future oriented, continue to focus mostly on disposition planning.  Describes ongoing insomnia although chart notes indicate he slept 5 to 6 hours last night.  Tolerating medications well.  Valproic acid level is subtherapeutic.  Treatment Plan Summary:  Treatment plan reviewed as below today 11/13 Encourage group and milieu participation Encourage efforts to work on sobriety and relapse prevention Treatment team working on disposition planning options- see above- plans to go to [facility] residential setting at discharge. 1.  Increase Depakote DR 500 mg p.o. qam and 1000 mgrs qhs  for mood disorder 2.  Continue folic acid 1 mg p.o. daily  3.  Continue Lorazepam 1 mg p.o. every 6 hours as needed a CIWA greater than 10. 4.  Continue Rozerem 8 mg p.o. nightly for insomnia 5.  Continue Zyprexa  20 mg p.o. nightly for mood disorder, psychosis and insomnia. 6.  Continue Protonix 40 mg p.o. daily for gastric protection. 7.  Continue thiamine /folate supplementation 8.  Continue  Trazodone 50 mg nightly as needed for insomnia 9.  Continue NicoDerm patch for nicotine cravings    Jenne Campus, MD 09/21/2019, 11:44 AM   Patient ID: [name] Catholic, male   DOB: 27-Aug-1976, 43 y.o.   MRN: [id number]   Patient ID: Abran Gavigan, male   DOB: 1976-07-05, 43 y.o.   MRN: [id number]

## 2019-09-21 NOTE — [facility] Group Notes (Signed)
LCSW Group Therapy Note  09/21/2019 3:07 PM  Type of Therapy/Topic: Group Therapy: Feelings about Diagnosis  Participation Level: Did Not Attend   Description of Group:  This group will allow patients to explore their thoughts and feelings about diagnoses they have received. Patients will be guided to explore their level of understanding and acceptance of these diagnoses. Facilitator will encourage patients to process their thoughts and feelings about the reactions of others to their diagnosis and will guide patients in identifying ways to discuss their diagnosis with significant others in their lives. This group will be process-oriented, with patients participating in exploration of their own experiences, giving and receiving support, and processing challenge from other group members.  Therapeutic Goals: 1. Patient will demonstrate understanding of diagnosis as evidenced by identifying two or more symptoms of the disorder 2. Patient will be able to express two feelings regarding the diagnosis 3. Patient will demonstrate their ability to communicate their needs through discussion and/or role play  Summary of Patient Progress: N/A  Therapeutic Modalities:  Cognitive Behavioral Therapy Brief Therapy Feelings Identification   Stephanie Acre, MSW, [place] Social Worker

## 2019-09-21 NOTE — Progress Notes (Signed)
D:  Patient denied SI and HI, contracts for safety.  Denied A/V hallucinations.   A:  Medications administered per MD orders.  Emotional support and encouragement given patient. R:  Safety maintained with 15 minute checks.  

## 2019-09-22 MED ORDER — OLANZAPINE 20 MG PO TABS: 20.0000 mg | ORAL_TABLET | Freq: Every day | ORAL | 0 refills | Status: DC

## 2019-09-22 MED ORDER — PANTOPRAZOLE SODIUM 40 MG PO TBEC: 40.0000 mg | DELAYED_RELEASE_TABLET | Freq: Every day | ORAL | 0 refills | Status: DC

## 2019-09-22 MED ORDER — RAMELTEON 8 MG PO TABS: 8.0000 mg | ORAL_TABLET | Freq: Every day | ORAL | 0 refills | Status: DC

## 2019-09-22 MED ORDER — NICOTINE 21 MG/24HR TD PT24: 21.0000 mg | MEDICATED_PATCH | Freq: Every day | TRANSDERMAL | 0 refills | Status: DC

## 2019-09-22 MED ORDER — ALBUTEROL SULFATE HFA 108 (90 BASE) MCG/ACT IN AERS: 1.0000 | INHALATION_SPRAY | Freq: Four times a day (QID) | RESPIRATORY_TRACT | 0 refills | Status: DC | PRN

## 2019-09-22 MED ORDER — DIVALPROEX SODIUM 500 MG PO DR TAB: DELAYED_RELEASE_TABLET | ORAL | 0 refills | Status: DC

## 2019-09-22 NOTE — Progress Notes (Signed)
Pt discharged to lobby. Pt was stable and appreciative at that time. All papers, samples and prescriptions were given and valuables returned. Verbal understanding expressed. Denies SI/HI and A/VH. Pt given opportunity to express concerns and ask questions.  

## 2019-09-22 NOTE — BHH Suicide Risk Assessment (Signed)
[facility] Discharge Suicide Risk Assessment   Principal Problem: Substance induced mood disorder ([place]) Discharge Diagnoses: Principal Problem:   Substance induced mood disorder ([place]) Active Problems:   Bipolar 1 disorder (HCC)   Cocaine dependence ([place])   Alcohol dependence ([place])   Antisocial personality disorder ([place])   Total Time spent with patient: 30 minutes  Musculoskeletal: Strength & Muscle Tone: within normal limits Gait & Station: normal Patient leans: N/A  Psychiatric Specialty Exam: ROS no chest pain, no shortness of breath, no nausea, no vomiting, no fever, no chills  Blood pressure 117/83, pulse (!) 102, temperature (!) 97.5 F (36.4 C), temperature source Oral, resp. rate 16, height 5\' 10"  (1.778 m), weight 86.6 kg, SpO2 100 %.Body mass index is 27.41 kg/m.  General Appearance: Fairly Groomed  Engineer, water::  Fair  Speech:  Normal [id number]  Volume:  Normal  Mood:  Acknowledges some improvement compared to admission.  Remains vaguely dysphoric and irritable  Affect:  Congruent  Thought Process:  Linear and Descriptions of Associations: Intact  Orientation:  Other:  Fully alert and attentive  Thought Content:  No hallucinations, no delusions  Suicidal Thoughts:  Endorses intermittent passive SI but denies any actual suicidal plan or intention and presents future oriented, stating for example that he plans to go to a temp agency on Monday seeking employment  Homicidal Thoughts:  No  Memory:  Recent and remote grossly intact  Judgement:  Other:  Fair/improving  Insight:  Fair  Psychomotor Activity:  Decreased, no psychomotor agitation.  Concentration:  Fair  Recall:  [employer] of Knowledge:Fair  Language: Good  Akathisia:  Negative  Handed:  Right  AIMS (if indicated):     Assets:  Desire for Improvement Resilience  Sleep:  Number of Hours: 6  Cognition: WNL  ADL's:  Intact   Mental Status Per Nursing Assessment::   On Admission:  Self-harm  thoughts  Demographic Factors:  43 year old male  Loss Factors: Housing issues, substance abuse  Historical Factors: History of substance abuse, bipolar disorder and PTSD per history  Risk Reduction Factors:   Positive coping skills or problem solving skills  Continued Clinical Symptoms:  Today patient presents alert, attentive, fairly groomed, improved eye contact, speech normal.  Describes some improvement compared to admission and acknowledges his mood has improved.  Does remain vaguely irritable and dysphoric in affect.  No thought disorder.  Today denies any suicidal plan or intention.  Endorses intermittent passive thoughts of death/passive SI without any current plan or intention and presents future oriented stating that his intention is to return to [facility] and to go to a temp agency on Monday in order to obtain employment.  Denies HI.  Denies hallucinations and does not appear internally preoccupied.  No delusions are expressed. Thus far tolerating medications well, currently on Depakote/Zyprexa.  Side effects have been reviewed. On unit has been isolative, with little interaction with peers.  No disruptive or agitated behaviors.  Cognitive Features That Contribute To Risk:  No gross cognitive deficits noted upon discharge. Is alert , attentive, and oriented x 3   Suicide Risk:  Mild:  Suicidal ideation of limited frequency, intensity, duration, and specificity.  There are no identifiable plans, no associated intent, mild dysphoria and related symptoms, good self-control (both objective and subjective assessment), few other risk factors, and identifiable protective factors, including available and accessible social support.  Follow-up Information    [facility]. Call on 09/24/2019.   Why: Your hospital discharge appointment is scheduled  for 09/24/2019 at 9:00am. The provider will call you, please answer your phone and have your current medications available. Your initial  appointment will be virtual.  Contact information: [address] [place] [address] [phone]           Plan Of Care/Follow-up recommendations:  Activity:  As tolerated Diet:  Regular Tests:  NA Other:  See below  Patient plans to return to [facility] sober residential setting  Follow up as above   Craige Cotta, MD 09/22/2019, 1:04 PM

## 2019-09-22 NOTE — Progress Notes (Signed)
   09/22/19 0000  Psych Admission Type (Psych Patients Only)  Admission Status Voluntary  Psychosocial Assessment  Patient Complaints Anxiety  Eye Contact Brief  Facial Expression Flat  Affect Irritable;Flat  Speech Logical/coherent  Interaction Isolative;Minimal  Motor Activity Other (Comment)  Appearance/Hygiene Disheveled  Behavior Characteristics Anxious  Mood Depressed  Thought Process  Coherency WDL  Content WDL  Delusions None reported or observed  Perception WDL  Hallucination None reported or observed  Judgment Impaired  Confusion None  Danger to Self  Current suicidal ideation? Denies  Self-Injurious Behavior No self-injurious ideation or behavior indicators observed or expressed   Agreement Not to Harm Self Yes  Description of Agreement verbally contracts for safety  Danger to Others  Danger to Others None reported or observed   Pt stayed to self in room, pt came out to get snack and take his medications

## 2019-09-22 NOTE — Discharge Summary (Addendum)
Physician Discharge Summary Note  Patient:  Ronald Brooks is an 43 y.o., male MRN:  [id number] DOB:  01-07-1976 Patient phone:  [phone] (home)  Patient address:   [address] [place] [address],  Total Time spent with patient: 15 minutes  Date of Admission:  09/15/2019 Date of Discharge: 09/22/2019  Reason for Admission:  Per admission assessment note:43 year old male, history of reported bipolar disorder, PTSD, cocaine use disorder. Presented to ED on 11/6 describing depression, suicidal ideations with thoughts of cutting himself, poor sleep, cocaine abuse. Admission BAL negative, admission UDS positive for cocaine. Home medications were listed as Depakote and Zyprexa. Today patient presents alert, attentive, calm without psychomotor agitation.  Remains dysphoric and vaguely irritable but behaviors in good control without any agitated or disruptive behaviors.  Since admission he is milieu participation has been limited, which he states is in part related to preferring to be alone, "not liking people".  We have reviewed this, patient describes some social anxiety type symptoms but mainly describes a vague sense of irritability and hypervigilance around others. As above, denies suicidal ideations and is currently future oriented.  He states that he spoke with " T", the manager at " [facility]" (the sober residential setting patient had been residing in).  States he has been tentatively accepted to go back.  He states he admitted he had relapsed on cocaine.  He had been fearful that if he did so he would not be allowed to return but expresses some relief that apparently he will be able to go back to [facility]. Valproic acid level is subtherapeutic at 29.   Principal Problem: Substance induced mood disorder [facility]) Discharge Diagnoses: Principal Problem:   Substance induced mood disorder (HCC) Active Problems:   Bipolar 1 disorder (HCC)   Cocaine dependence (HCC)  Alcohol dependence (HCC)   Antisocial personality disorder (HCC)   Past Psychiatric History:   Past Medical History:  Past Medical History:  Diagnosis Date  . ADHD   . Asthma   . Bipolar 1 disorder (HCC)    History reviewed. No pertinent surgical history. Family History: History reviewed. No pertinent family history. Family Psychiatric  History:  Social History:  Social History   Substance and Sexual Activity  Alcohol Use Yes   Comment: occasional      Social History   Substance and Sexual Activity  Drug Use No   Comment: has not used cocain and ETOH for 29 days    Social History   Socioeconomic History  . Marital status: Legally Separated    Spouse name: Not on file  . Number of children: Not on file  . Years of education: Not on file  . Highest education level: Not on file  Occupational History  . Not on file  Social Needs  . Financial resource strain: Not on file  . Food insecurity    Worry: Not on file    Inability: Not on file  . Transportation needs    Medical: Not on file    Non-medical: Not on file  Tobacco Use  . Smoking status: Current Every Day Smoker    Packs/day: 0.50    Types: Cigarettes  . Smokeless tobacco: Never Used  Substance and Sexual Activity  . Alcohol use: Yes    Comment: occasional   . Drug use: No    Comment: has not used cocain and ETOH for 29 days  . Sexual activity: Not on file  Lifestyle  . Physical activity  Days per week: Not on file    Minutes per session: Not on file  . Stress: Not on file  Relationships  . Social Musician on phone: Not on file    Gets together: Not on file    Attends religious service: Not on file    Active member of club or organization: Not on file    Attends meetings of clubs or organizations: Not on file    Relationship status: Not on file  Other Topics Concern  . Not on file  Social History Narrative  . Not on file    Hospital Course:  Chamberlain Steinborn was admitted for  Substance induced mood disorder (HCC)  and crisis management.  Pt was treated discharged with the medications listed below under Medication List.  Medical problems were identified and treated as needed.  Home medications were restarted as appropriate.  Improvement was monitored by observation and Minus Breeding 's daily report of symptom reduction.  Emotional and mental status was monitored by daily self-inventory reports completed by Minus Breeding and clinical staff.         Keeyon Privitera was evaluated by the treatment team for stability and plans for continued recovery upon discharge. Omar Gayden 's motivation was an integral factor for scheduling further treatment. Employment, transportation, bed availability, health status, family support, and any pending legal issues were also considered during hospital stay. Pt was offered further treatment options upon discharge including but not limited to Residential, Intensive Outpatient, and Outpatient treatment.  Jaqua Ching will follow up with the services as listed below under Follow Up Information.     Upon completion of this admission the patient was both mentally and medically stable for discharge denying suicidal or homicidal ideation, auditory or visual  hallucinations  Adriaan Hedges responded well to treatment with Zyprexa 20 mg, Ramlteon 8 mg and Depakote 1500 mg without adverse effects. Pt demonstrated improvement without reported or observed adverse effects to the point of stability appropriate for outpatient management. Pertinent labs include: for which outpatient follow-up is necessary for lab recheck as mentioned below. Reviewed CBC, CMP, BAL, and UDS+ Cocaine; all unremarkable aside from noted exceptions.   Physical Findings: AIMS: Facial and Oral Movements Muscles of Facial Expression: None, normal Lips and Perioral Area: None, normal Jaw: None, normal Tongue: None, normal,Extremity Movements Upper (arms, wrists, hands,  fingers): None, normal Lower (legs, knees, ankles, toes): None, normal, Trunk Movements Neck, shoulders, hips: None, normal, Overall Severity Severity of abnormal movements (highest score from questions above): None, normal Incapacitation due to abnormal movements: None, normal Patient's awareness of abnormal movements (rate only patient's report): No Awareness, Dental Status Current problems with teeth and/or dentures?: No Does patient usually wear dentures?: No  CIWA:  CIWA-Ar Total: 1 COWS:  COWS Total Score: 3  Musculoskeletal: Strength & Muscle Tone: within normal limits Gait & Station: normal Patient leans: N/A  Psychiatric Specialty Exam: See SRA by MD  Physical Exam  Constitutional: He appears well-developed.  Skin: Skin is warm.    Review of Systems  Psychiatric/Behavioral: Positive for depression and substance abuse. Negative for suicidal ideas.  All other systems reviewed and are negative.   Blood pressure 117/83, pulse (!) 102, temperature (!) 97.5 F (36.4 C), temperature source Oral, resp. rate 16, height 5\' 10"  (1.778 m), weight 86.6 kg, SpO2 100 %.Body mass index is 27.41 kg/m.    Have you used any form of tobacco in the last 30 days? (Cigarettes, Smokeless Tobacco, Cigars, and/or  Pipes): Yes  Has this patient used any form of tobacco in the last 30 days? (Cigarettes, Smokeless Tobacco, Cigars, and/or Pipes) Yes, Yes, A prescription for an FDA-approved tobacco cessation medication was offered at discharge and the patient refused  Blood Alcohol level:  Lab Results  Component Value Date   ETH <10 09/14/2019   ETH <10 43/32/9518    Metabolic Disorder Labs:  Lab Results  Component Value Date   HGBA1C 5.3 09/17/2019   MPG 105.41 09/17/2019   No results found for: PROLACTIN No results found for: CHOL, TRIG, HDL, CHOLHDL, VLDL, LDLCALC  See Psychiatric Specialty Exam and Suicide Risk Assessment completed by Attending Physician prior to discharge.  Discharge  destination:  Home  Is patient on multiple antipsychotic therapies at discharge:  No   Has Patient had three or more failed trials of antipsychotic monotherapy by history:  No  Recommended Plan for Multiple Antipsychotic Therapies: NA  Discharge Instructions    Discharge instructions   Complete by: As directed    Patient is instructed to take all prescribed medications as recommended. Report any side effects or adverse reactions to your outpatient psychiatrist. Patient is instructed to abstain from alcohol and illegal drugs while on prescription medications. In the event of worsening symptoms, patient is instructed to call the crisis hotline, 911, or go to the nearest emergency department for evaluation and treatment.     Allergies as of 09/22/2019      Reactions   Other Shortness Of Breath   "dander"   Vitamin E Rash      Medication List    TAKE these medications     Indication  albuterol 108 (90 Base) MCG/ACT inhaler Commonly known as: VENTOLIN HFA Inhale 1-2 puffs into the lungs every 6 (six) hours as needed for wheezing or shortness of breath.  Indication: Asthma   divalproex 500 MG DR tablet Commonly known as: DEPAKOTE Take one tablet (500 mg) by mouth each morning and two tablets (1000 mg) by mouth at bedtime. What changed:   how much to take  how to take this  when to take this  additional instructions  Indication: Depressive Phase of Manic-Depression   nicotine 21 mg/24hr patch Commonly known as: NICODERM CQ - dosed in mg/24 hours Place 1 patch (21 mg total) onto the skin daily.  Indication: Nicotine Addiction   OLANZapine 20 MG tablet Commonly known as: ZYPREXA Take 1 tablet (20 mg total) by mouth at bedtime. What changed:   medication strength  how much to take  when to take this  Indication: Depressive Phase of Manic-Depression   pantoprazole 40 MG tablet Commonly known as: PROTONIX Take 1 tablet (40 mg total) by mouth daily.  Indication:  Gastroesophageal Reflux Disease   ramelteon 8 MG tablet Commonly known as: ROZEREM Take 1 tablet (8 mg total) by mouth at bedtime.  Indication: Trouble Sleeping      Follow-up [facility]. Call on 09/24/2019.   Why: Your hospital discharge appointment is scheduled for 09/24/2019 at 9:00am. The provider will call you, please answer your phone and have your current medications available. Your initial appointment will be virtual.  Contact information: [address] [name] [address] [phone]           Follow-up recommendations:  Activity:  as tolerated  Diet:  heart healthy   Comments:  Take all medications as prescribed. Keep all follow-up appointments as scheduled.  Do not consume alcohol or use illegal drugs while on  prescription medications. Report any adverse effects from your medications to your primary care provider promptly.  In the event of recurrent symptoms or worsening symptoms, call 911, a crisis hotline, or go to the nearest emergency department for evaluation.   Signed: Oneta Rackanika N Lewis, NP 09/22/2019, 10:06 AM   Patient seen, Suicide Assessment Completed.  Disposition Plan Reviewed

## 2019-09-22 NOTE — Progress Notes (Signed)
[place] NOVEL CORONAVIRUS (COVID-19) DAILY CHECK-OFF SYMPTOMS - answer yes or no to each - every day NO YES  Have you had a fever in the past 24 hours?  . Fever (Temp > 37.80C / 100F) X   Have you had any of these symptoms in the past 24 hours? . New Cough .  Sore Throat  .  Shortness of Breath .  Difficulty Breathing .  Unexplained Body Aches   X   Have you had any one of these symptoms in the past 24 hours not related to allergies?   . Runny Nose .  Nasal Congestion .  Sneezing   X   If you have had runny nose, nasal congestion, sneezing in the past 24 hours, has it worsened?  X   EXPOSURES - check yes or no X   Have you traveled outside the state in the past 14 days?  X   Have you been in contact with someone with a confirmed diagnosis of COVID-19 or PUI in the past 14 days without wearing appropriate PPE?  X   Have you been living in the same home as a person with confirmed diagnosis of COVID-19 or a PUI (household contact)?    X   Have you been diagnosed with COVID-19?    X              What to do next: Answered NO to all: Answered YES to anything:   Proceed with unit schedule Follow the [facility] Inpatient Flowsheet.   

## 2019-09-22 NOTE — BHH Group Notes (Signed)
LCSW Group Therapy Note  Date/Time:  09/22/2019   10:00AM-11:00AM  Type of Therapy and Topic:  Group Therapy:  Fears and Unhealthy/Healthy Coping Skills  Participation Level:  Did Not Attend   Description of Group:  The focus of this group was to discuss some of the prevalent fears that patients experience, and to identify the commonalities among group members.  A fun exercise was used to initiate the discussion, followed by writing on the white board a group-generated list of unhealthy coping and healthy coping techniques to deal with each fear.    Therapeutic Goals: 1. Patient will be able to distinguish between healthy and unhealthy coping skills 2. Patient will identify and describe 3 fears they experience 3. Patient will identify one positive coping strategy for each fear they experience 4. Patient will respond empathetically to peers' statements regarding fears they experience  Summary of Patient Progress:  The patient was invited to group but chose not to attent.  Therapeutic Modalities Cognitive Behavioral Therapy Motivational Interviewing  Selmer Dominion, LCSW

## 2019-09-22 NOTE — Progress Notes (Signed)
D. Pt presents with a flat affect/ depressed mood- somewhat isolative- remaining in bed this am.  Pt currently denies SI/HI and AVH A. Labs and vitals monitored.Pt supported emotionally and encouraged to express concerns and ask questions.   R. Pt remains safe with 15 minute checks. Will continue POC.

## 2019-09-22 NOTE — Progress Notes (Signed)
  [facility] Adult Case Management Discharge Plan :  Will you be returning to the same living situation after discharge:  Yes,  [facility] At discharge, do you have transportation home?: Yes,  arranged by patient, likely pick-up by manager Do you have the ability to pay for your medications: No.  No insurance  Release of information consent forms completed and emailed to Medical Records, then turned in.  Patient to Follow up at: Follow-up Information    [facility]. Call on 09/24/2019.   Why: Your hospital discharge appointment is scheduled for 09/24/2019 at 9:00am. The provider will call you, please answer your phone and have your current medications available. Your initial appointment will be virtual.  Contact information: [place] [address] [phone]           Next level of care provider has access to [place] and Suicide Prevention discussed: No.  Patient declined so it was discussed just with him.  Have you used any form of tobacco in the last 30 days? (Cigarettes, Smokeless Tobacco, Cigars, and/or Pipes): Yes  Has patient been referred to the Quitline?: Patient refused referral  Patient has been referred for addiction treatment: Pt. refused referral  [facility] can cover this if patient allows  Maretta Los, LCSW 09/22/2019, 9:48 AM

## 2019-09-28 ENCOUNTER — Encounter (HOSPITAL_COMMUNITY): Payer: Self-pay | Admitting: Emergency Medicine

## 2019-09-28 ENCOUNTER — Other Ambulatory Visit: Payer: Self-pay

## 2019-09-28 ENCOUNTER — Emergency Department (HOSPITAL_COMMUNITY)
Admission: EM | Admit: 2019-09-28 | Discharge: 2019-09-28 | Disposition: A | Discharge status: Home / Self Care | Payer: Self-pay | Attending: Emergency Medicine | Admitting: Emergency Medicine

## 2019-09-28 DIAGNOSIS — J45909 Unspecified asthma, uncomplicated: Secondary | ICD-10-CM | POA: Insufficient documentation

## 2019-09-28 DIAGNOSIS — F17210 Nicotine dependence, cigarettes, uncomplicated: Secondary | ICD-10-CM | POA: Insufficient documentation

## 2019-09-28 DIAGNOSIS — Z79899 Other long term (current) drug therapy: Secondary | ICD-10-CM | POA: Insufficient documentation

## 2019-09-28 DIAGNOSIS — R45851 Suicidal ideations: Secondary | ICD-10-CM

## 2019-09-28 DIAGNOSIS — F3132 Bipolar disorder, current episode depressed, moderate: Secondary | ICD-10-CM | POA: Insufficient documentation

## 2019-09-28 DIAGNOSIS — F602 Antisocial personality disorder: Secondary | ICD-10-CM | POA: Insufficient documentation

## 2019-09-28 LAB — COMPREHENSIVE METABOLIC PANEL
ALT: 19 U/L (ref 0–44)
AST: 32 U/L (ref 15–41)
Albumin: 4 g/dL (ref 3.5–5.0)
Alkaline Phosphatase: 49 U/L (ref 38–126)
Anion gap: 12 (ref 5–15)
BUN: 12 mg/dL (ref 6–20)
CO2: 23 mmol/L (ref 22–32)
Calcium: 9.2 mg/dL (ref 8.9–10.3)
Chloride: 103 mmol/L (ref 98–111)
Creatinine, Ser: 0.87 mg/dL (ref 0.61–1.24)
GFR calc Af Amer: >60 mL/min (ref >60)
GFR calc non Af Amer: >60 mL/min (ref >60)
Glucose, Bld: 74 mg/dL (ref 70–99)
Potassium: 4.4 mmol/L (ref 3.5–5.1)
Sodium: 138 mmol/L (ref 135–145)
Total Bilirubin: 0.7 mg/dL (ref 0.3–1.2)
Total Protein: 7.1 g/dL (ref 6.5–8.1)

## 2019-09-28 LAB — SALICYLATE LEVEL: Salicylate Lvl: <7 mg/dL (ref 2.8–30.0)

## 2019-09-28 LAB — CBC
HCT: 41.6 % (ref 39.0–52.0)
Hemoglobin: 13.7 g/dL (ref 13.0–17.0)
MCH: 29.5 pg (ref 26.0–34.0)
MCHC: 32.9 g/dL (ref 30.0–36.0)
MCV: 89.7 fL (ref 80.0–100.0)
Platelets: 247 10*3/uL (ref 150–400)
RBC: 4.64 MIL/uL (ref 4.22–5.81)
RDW: 12.6 % (ref 11.5–15.5)
WBC: 12.2 10*3/uL — ABNORMAL HIGH (ref 4.0–10.5)
nRBC: 0 % (ref 0.0–0.2)

## 2019-09-28 LAB — RAPID URINE DRUG SCREEN, HOSP PERFORMED
Amphetamines: NOT DETECTED
Barbiturates: NOT DETECTED
Benzodiazepines: NOT DETECTED
Cocaine: POSITIVE — AB
Opiates: NOT DETECTED
Tetrahydrocannabinol: NOT DETECTED

## 2019-09-28 LAB — ACETAMINOPHEN LEVEL: Acetaminophen (Tylenol), Serum: <10 ug/mL — ABNORMAL LOW (ref 10–30)

## 2019-09-28 LAB — ETHANOL: Alcohol, Ethyl (B): <10 mg/dL (ref <10)

## 2019-09-28 NOTE — ED Notes (Signed)
Breakfast ordered 

## 2019-09-28 NOTE — Consult Note (Signed)
Ronald Brooks is a 43 year old male who presents with suicidal ideations. He reports chronic suicidal ideations and is uncooperative with the assessment. Prior to assessment patient asked "I did this already, cant you just read the chart.' Writer attempted to continue evaluation with the patient however he reports that I need to read the chart, 'Im here for the same reason I told you last night. " patient is observed drinking a coke cola. He is irritable and agitated at this time. He reports his medications are not working, he is advised that the medication was just started last week and takes about 4 weeks to be therapeutic. He is advised that this medication has not had time to take effect yet. He also minimizes his drug use and denies cocaine use when questioned. He was recently discharged after being at [facility] for 1 week. He had another admission at [facility], [facility], and [facility] within the past 2 months. At this time patient does endorse suicidal ideations with a plan to cut himself, however has chronic suicidal ideations.   43 year old male returns with recurrent suicidal thoughts and cocaine intoxication. He has numerous similar presentations which are concerning for malingering behavior. He has frequented all the local hospitals 4 admissions since 07/2019. He does endorse ongoing suicidal thoughts related to his cocaine use. And reports suicidal ideations at this time. This patient has chronic suicidal ideations related to his chronic cocaine abuse, at this time he expresses no interest in quitting his cocaine use but is seeking help for secondary gain and housing due to being homeless. He continues to use and is not ready for change at this time. He has been admitted 4x as noted in the past 5 weeks with most recent admission last week.  Will discharge at this time.

## 2019-09-28 NOTE — ED Notes (Signed)
Breakfast tray set up at bedside  

## 2019-09-28 NOTE — ED Provider Notes (Signed)
TTS assessed pt and advised pt can be discharged with outpatient follow up.    Fransico Meadow, [place] 09/28/19 1353    Veryl Speak, MD 09/28/19 [phone]
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
## 2019-09-28 NOTE — ED Notes (Signed)
Patient Alert and oriented to baseline. Stable and ambulatory to baseline. Patient verbalized understanding of the discharge instructions.  Patient belongings were taken by the patient.   

## 2019-09-28 NOTE — ED Triage Notes (Signed)
Patient reports suicidal ideation plans to overdose or cut his wrist , denies hallucinations , patient stated that he has not taken his medications for several weeks .

## 2019-09-28 NOTE — ED Provider Notes (Signed)
[place] EMERGENCY DEPARTMENT Provider Note   CSN: [id number] Arrival date & time: 09/28/19  0334     History   Chief Complaint Chief Complaint  Patient presents with  . Suicidal    HPI Ronald Brooks is a 43 y.o. male.     Patient is a 43 year old male with history of bipolar, ADHD, and asthma.  He presents today for evaluation of suicidal ideation.  He reports plans to overdose or cut his wrists.  He tells me he does not feel as though his medications are working.  Patient admits to using cocaine 2 days ago but denies other alcohol or drug use.  The history is provided by the patient.    Past Medical History:  Diagnosis Date  . ADHD   . Asthma   . Bipolar 1 disorder [facility])     Patient Active Problem List   Diagnosis Date Noted  . Bipolar 1 disorder ([place]) 09/15/2019  . Substance induced mood disorder ([place]) 09/15/2019  . Cocaine dependence ([place]) 09/15/2019  . Alcohol dependence ([place]) 09/15/2019  . Antisocial personality disorder ([place]) 09/15/2019  . Cocaine use disorder ([place]) 09/14/2019  . 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 No pertinent surgical history.      Home Medications    Prior to Admission medications   Medication Sig Start Date End Date Taking? Authorizing Provider  albuterol (VENTOLIN HFA) 108 (90 Base) MCG/ACT inhaler Inhale 1-2 puffs into the lungs every 6 (six) hours as needed for wheezing or shortness of breath. 09/22/19   Connye Burkitt, NP  divalproex (DEPAKOTE) 500 MG DR tablet Take one tablet (500 mg) by mouth each morning and two tablets (1000 mg) by mouth at bedtime. 09/22/19   Connye Burkitt, NP  nicotine (NICODERM CQ - DOSED IN MG/24 HOURS) 21 mg/24hr patch Place 1 patch (21 mg total) onto the skin daily. 09/22/19   Connye Burkitt, NP  OLANZapine (ZYPREXA) 20 MG tablet Take 1 tablet (20 mg total) by mouth at bedtime. 09/22/19   Connye Burkitt, NP  pantoprazole  (PROTONIX) 40 MG tablet Take 1 tablet (40 mg total) by mouth daily. 09/22/19   Connye Burkitt, NP  ramelteon (ROZEREM) 8 MG tablet Take 1 tablet (8 mg total) by mouth at bedtime. 09/22/19   Connye Burkitt, NP    Family History No family history on file.  Social History Social History   Tobacco Use  . Smoking status: Current Every Day Smoker    Packs/day: 0.50    Types: Cigarettes  . Smokeless tobacco: Never Used  Substance Use Topics  . Alcohol use: Yes    Comment: occasional   . Drug use: No    Comment: has not used cocain and ETOH for 29 days     Allergies   Other and Vitamin e   Review of Systems Review of Systems  All other systems reviewed and are negative.    Physical Exam Updated Vital Signs BP 116/79 (BP Location: Left Arm)   Pulse 88   Temp 98.5 F (36.9 C) (Oral)   Resp 20   Ht 5\' 10"  (1.778 m)   Wt 90.7 kg   SpO2 97%   BMI 28.70 kg/m   Physical Exam Vitals signs and nursing note reviewed.  Constitutional:      General: He is not in acute distress.    Appearance: He is well-developed. He is not diaphoretic.  HENT:  Head: Normocephalic and atraumatic.  Neck:     Musculoskeletal: Normal range of motion and neck supple.  Cardiovascular:     Rate and Rhythm: Normal rate and regular rhythm.     Heart sounds: No murmur. No friction rub.  Pulmonary:     Effort: Pulmonary effort is normal. No respiratory distress.     Breath sounds: Normal breath sounds. No wheezing or rales.  Abdominal:     General: Bowel sounds are normal. There is no distension.     Palpations: Abdomen is soft.     Tenderness: There is no abdominal tenderness.  Musculoskeletal: Normal range of motion.  Skin:    General: Skin is warm and dry.  Neurological:     Mental Status: He is alert and oriented to person, place, and time.     Coordination: Coordination normal.  Psychiatric:        Attention and Perception: Attention and perception normal.        Mood and Affect:  Mood and affect normal.        Speech: Speech normal.        Behavior: Behavior is agitated and aggressive.        Thought Content: Thought content includes suicidal ideation. Thought content does not include homicidal ideation. Thought content includes suicidal plan. Thought content does not include homicidal plan.        Cognition and Memory: Cognition normal.        Judgment: Judgment is impulsive and inappropriate.      ED Treatments / Results  Labs (all labs ordered are listed, but only abnormal results are displayed) Labs Reviewed  COMPREHENSIVE METABOLIC PANEL  ETHANOL  SALICYLATE LEVEL  ACETAMINOPHEN LEVEL  CBC  RAPID URINE DRUG SCREEN, HOSP PERFORMED    EKG None  Radiology No results found.  Procedures Procedures (including critical care time)  Medications Ordered in ED Medications - No data to display   Initial Impression / Assessment and Plan / ED Course  I have reviewed the triage vital signs and the nursing notes.  Pertinent labs & imaging results that were available during my care of the patient were reviewed by me and considered in my medical decision making (see chart for details).  Patient has been evaluated by TTS who is recommending formal psychiatric consultation in the morning.  Patient will remain in the department until that time.  Laboratory studies are unremarkable and he appears otherwise medically cleared.  Final Clinical Impressions(s) / ED Diagnoses   Final diagnoses:  None    ED Discharge Orders    None       Geoffery Lyons, MD 09/28/19 [phone]

## 2019-10-13 ENCOUNTER — Other Ambulatory Visit: Payer: Self-pay

## 2019-10-13 ENCOUNTER — Encounter (HOSPITAL_COMMUNITY): Payer: Self-pay

## 2019-10-13 ENCOUNTER — Ambulatory Visit (HOSPITAL_COMMUNITY): Payer: Self-pay

## 2019-10-13 ENCOUNTER — Observation Stay (HOSPITAL_COMMUNITY)
Admission: RE | Admit: 2019-10-13 | Discharge: 2019-10-14 | Disposition: A | Discharge status: Home / Self Care | Payer: No Typology Code available for payment source | Attending: Psychiatry | Admitting: Psychiatry

## 2019-10-13 DIAGNOSIS — R45851 Suicidal ideations: Secondary | ICD-10-CM | POA: Insufficient documentation

## 2019-10-13 DIAGNOSIS — J45909 Unspecified asthma, uncomplicated: Secondary | ICD-10-CM | POA: Insufficient documentation

## 2019-10-13 DIAGNOSIS — Z560 Unemployment, unspecified: Secondary | ICD-10-CM | POA: Insufficient documentation

## 2019-10-13 DIAGNOSIS — F332 Major depressive disorder, recurrent severe without psychotic features: Secondary | ICD-10-CM | POA: Insufficient documentation

## 2019-10-13 DIAGNOSIS — F4310 Post-traumatic stress disorder, unspecified: Secondary | ICD-10-CM | POA: Insufficient documentation

## 2019-10-13 DIAGNOSIS — Z9114 Patient's other noncompliance with medication regimen: Secondary | ICD-10-CM | POA: Insufficient documentation

## 2019-10-13 DIAGNOSIS — G4700 Insomnia, unspecified: Secondary | ICD-10-CM | POA: Insufficient documentation

## 2019-10-13 DIAGNOSIS — Z590 Homelessness: Secondary | ICD-10-CM | POA: Insufficient documentation

## 2019-10-13 DIAGNOSIS — F17210 Nicotine dependence, cigarettes, uncomplicated: Secondary | ICD-10-CM | POA: Insufficient documentation

## 2019-10-13 DIAGNOSIS — K0889 Other specified disorders of teeth and supporting structures: Secondary | ICD-10-CM | POA: Insufficient documentation

## 2019-10-13 DIAGNOSIS — F319 Bipolar disorder, unspecified: Principal | ICD-10-CM | POA: Diagnosis present

## 2019-10-13 DIAGNOSIS — R45850 Homicidal ideations: Secondary | ICD-10-CM | POA: Insufficient documentation

## 2019-10-13 DIAGNOSIS — F1020 Alcohol dependence, uncomplicated: Secondary | ICD-10-CM | POA: Diagnosis present

## 2019-10-13 DIAGNOSIS — Z888 Allergy status to other drugs, medicaments and biological substances status: Secondary | ICD-10-CM | POA: Insufficient documentation

## 2019-10-13 DIAGNOSIS — F1410 Cocaine abuse, uncomplicated: Secondary | ICD-10-CM | POA: Diagnosis present

## 2019-10-13 DIAGNOSIS — Z20828 Contact with and (suspected) exposure to other viral communicable diseases: Secondary | ICD-10-CM | POA: Insufficient documentation

## 2019-10-13 LAB — SARS CORONAVIRUS 2 BY RT PCR (HOSPITAL ORDER, PERFORMED IN ~~LOC~~ HOSPITAL LAB): SARS Coronavirus 2: NEGATIVE

## 2019-10-13 LAB — CBC
HCT: 44.7 % (ref 39.0–52.0)
Hemoglobin: 14.3 g/dL (ref 13.0–17.0)
MCH: 29.5 pg (ref 26.0–34.0)
MCHC: 32 g/dL (ref 30.0–36.0)
MCV: 92.2 fL (ref 80.0–100.0)
Platelets: 226 10*3/uL (ref 150–400)
RBC: 4.85 MIL/uL (ref 4.22–5.81)
RDW: 14.1 % (ref 11.5–15.5)
WBC: 8.2 10*3/uL (ref 4.0–10.5)
nRBC: 0 % (ref 0.0–0.2)

## 2019-10-13 LAB — COMPREHENSIVE METABOLIC PANEL
ALT: 16 U/L (ref 0–44)
AST: 18 U/L (ref 15–41)
Albumin: 3.5 g/dL (ref 3.5–5.0)
Alkaline Phosphatase: 44 U/L (ref 38–126)
Anion gap: 8 (ref 5–15)
BUN: 22 mg/dL — ABNORMAL HIGH (ref 6–20)
CO2: 24 mmol/L (ref 22–32)
Calcium: 8.7 mg/dL — ABNORMAL LOW (ref 8.9–10.3)
Chloride: 109 mmol/L (ref 98–111)
Creatinine, Ser: 0.92 mg/dL (ref 0.61–1.24)
GFR calc Af Amer: >60 mL/min (ref >60)
GFR calc non Af Amer: >60 mL/min (ref >60)
Glucose, Bld: 121 mg/dL — ABNORMAL HIGH (ref 70–99)
Potassium: 4.6 mmol/L (ref 3.5–5.1)
Sodium: 141 mmol/L (ref 135–145)
Total Bilirubin: 0.9 mg/dL (ref 0.3–1.2)
Total Protein: 6.3 g/dL — ABNORMAL LOW (ref 6.5–8.1)

## 2019-10-13 MED ORDER — ACETAMINOPHEN 325 MG PO TABS: 650.0000 mg | ORAL_TABLET | Freq: Four times a day (QID) | ORAL | Status: DC | PRN

## 2019-10-13 MED ORDER — NICOTINE 21 MG/24HR TD PT24
21.0000 mg | MEDICATED_PATCH | Freq: Every day | TRANSDERMAL | Status: DC
  Administered 2019-10-14: 21 mg via TRANSDERMAL
  Filled 2019-10-13 (×2): qty 1

## 2019-10-13 MED ORDER — ALUM & MAG HYDROXIDE-SIMETH 200-200-20 MG/5ML PO SUSP: 30.0000 mL | ORAL | Status: DC | PRN

## 2019-10-13 MED ORDER — MAGNESIUM HYDROXIDE 400 MG/5ML PO SUSP: 30.0000 mL | Freq: Every day | ORAL | Status: DC | PRN

## 2019-10-13 MED ORDER — OLANZAPINE 10 MG PO TABS: 10.0000 mg | ORAL_TABLET | Freq: Every day | ORAL | Status: DC

## 2019-10-13 MED ORDER — OLANZAPINE 10 MG PO TABS
10.0000 mg | ORAL_TABLET | Freq: Every day | ORAL | Status: DC
  Administered 2019-10-13 (×2): 10 mg via ORAL
  Filled 2019-10-13 (×2): qty 1

## 2019-10-13 MED ORDER — PANTOPRAZOLE SODIUM 40 MG PO TBEC
40.0000 mg | DELAYED_RELEASE_TABLET | Freq: Every day | ORAL | Status: DC
  Administered 2019-10-13 – 2019-10-14 (×2): 40 mg via ORAL
  Filled 2019-10-13: qty 1

## 2019-10-13 MED ORDER — HYDROXYZINE HCL 25 MG PO TABS
25.0000 mg | ORAL_TABLET | Freq: Three times a day (TID) | ORAL | Status: DC | PRN
  Administered 2019-10-13: 25 mg via ORAL
  Filled 2019-10-13: qty 1

## 2019-10-13 MED ORDER — RAMELTEON 8 MG PO TABS
8.0000 mg | ORAL_TABLET | Freq: Every day | ORAL | Status: DC
  Administered 2019-10-13: 8 mg via ORAL
  Filled 2019-10-13: qty 1

## 2019-10-13 MED ORDER — KETOROLAC TROMETHAMINE 30 MG/ML IJ SOLN
30.0000 mg | Freq: Once | INTRAMUSCULAR | Status: AC
  Administered 2019-10-13: 30 mg via INTRAMUSCULAR
  Filled 2019-10-13: qty 1

## 2019-10-13 MED ORDER — BENZOCAINE 10 % MT GEL
Freq: Four times a day (QID) | OROMUCOSAL | Status: DC | PRN
  Filled 2019-10-13: qty 9

## 2019-10-13 MED ORDER — TRAZODONE HCL 50 MG PO TABS: 50.0000 mg | ORAL_TABLET | Freq: Every evening | ORAL | Status: DC | PRN

## 2019-10-13 NOTE — H&P (Signed)
BH Observation Unit Provider Admission PAA/H&P  Patient Identification: Ronald BreedingJonathon Brooks MRN:  [id number] Date of Evaluation:  10/13/2019 Chief Complaint:  Bipolar disorder depressed cocaine use disorder  Principal Diagnosis: Bipolar 1 disorder (HCC) Diagnosis:  Principal Problem:   Bipolar 1 disorder (HCC) Active Problems:   Cocaine use disorder (HCC)   Alcohol dependence (HCC)  History of Present Illness:  TTS Assessment:  Ronald Brooks is an 43 y.o. separated male who [facility]accompan[facility] reporting symptoms of depression including suicidal ideation with plan to cut himself. Pt has a diagnosis of bipolar disorder and says has been off psychiatric medications for three weeks because he lost them. He says he is homeless and is experiencing severe tooth pain. He is irritable and gave brief responses to questions. He report one previous suicide attempt by cutting himself. He denies problems with sleep or appetite. He says he sometimes has thoughts of hurting people but denies any plan or intent to harm anyone. He denies auditory or visual hallucinations.   Pt reports he uses cocaine daily when available. He says he also drinks 1-2 beers daily when available. He says he rarely uses marijuana. He denies other substance use.  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 He cannot identify any family or friends who are supportive. He says he is on probation for obtaining property under false pretenses. He confirms he was last psychiat[facility]. He reports inpatient treatment at [facility] approximately two months ago, followed by admission to [facility]. He then went to an [address] in [place]. He has presented to area EDs several times.  Pt does not identify anyone to contact for collateral information.   Evaluation on Unit: Reviewed TTS assessment and validated with patient. On evaluation patient is alert and oriented x  4, irritable, but cooperative. Speech is clear and coherent. Mood is depressed/anxious and affect is congruent with mood. Thought process is coherent and thought content is logical. Reports suicidal thoughts with a plan to cut his wrists. 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 Denies any homicidal intent or specific plan.  Denies audiovisual hallucinations. No indication that patient is responding to internal stimuli.   Patient reports tooth pain. States that this is an ongoing problem. He is requesting pain medication. States that he has not been taking his medications.   Associated Signs/Symptoms: Depression Symptoms:  depressed mood, anhedonia, insomnia, psychomotor agitation, hopelessness, suicidal thoughts with specific plan, anxiety, (Hypo) Manic Symptoms:  Distractibility, Impulsivity, Irritable Mood, Labiality of Mood, Anxiety Symptoms:  Excessive Worry, Psychotic Symptoms:  Denies PTSD Symptoms: Had a traumatic exposure:  in the past Total Time spent with patient: 30 minutes  Past Psychiatric History: Polysubstance use disorder, substance-induced mood disorder, bipolar disorder, PTSD. He confirms he was last psychiat[facility]. He reports inpatient [facility] at [facility] approximately two months ago, followed by admission to [facility] He then went to an [address] [facility] [place]. He has presented to area EDs several times.   Is the patient at risk to self? Yes.    Has the patient been a risk to self in the past 6 months? Yes.    Has the patient been a risk to self within the distant past? Yes.    Is the patient a risk to others? Yes.    Has the patient been a risk to others in the past 6 months? No.  Has the patient been a risk  to others within the distant past? No.   Prior Inpatient Therapy: Prior Inpatient Therapy: Yes Prior Therapy Dates: Nov 7-14 '20; 08/2018 Prior Therapy Facilty/Provider(s): [facility]; [facility]  Reason for Treatment: Bipolar Disorder Prior Outpatient Therapy: Prior Outpatient Therapy: No Does patient have an ACCT team?: No Does patient have Intensive In-House Services?  : No Does patient have [facility] services? : No Does patient have P4CC services?: No  Alcohol Screening:   Substance Abuse History in the last 12 months:  Yes.   Consequences of Substance Abuse: Medical Consequences:  substance induced mood disorder Previous Psychotropic Medications: Yes  Psychological Evaluations: Yes  Past Medical History:  Past Medical History:  Diagnosis Date  . ADHD   . Asthma   . Bipolar 1 disorder (HCC)    No past surgical history on file. Family History: No family history on file. Family Psychiatric History: no pertinent history Tobacco Screening:   Social History:  Social History   Substance and Sexual Activity  Alcohol Use Yes   Comment: occasional      Social History   Substance and Sexual Activity  Drug Use No   Comment: has not used cocain and ETOH for 29 days    Additional Social History: Marital status: Separated    Pain Medications: Denies abuse Prescriptions: Denies abuse Over the Counter: Denies abuse History of alcohol / drug use?: Yes Longest period of sobriety (when/how long): Unknown Negative Consequences of Use: Financial, Legal, Personal relationships, Work / School Name of Substance 1: Cocaine 1 - Age of First Use: Adolescent 1 - Amount (size/oz): Varies 1 - Frequency: Daily when available 1 - Duration: Ongoing 1 - Last Use / Amount: 10/12/2019                  Allergies:   Allergies  Allergen Reactions  . Other Shortness Of Breath    "dander"  . Vitamin E Rash   Lab Results: No results found for this or any previous visit (from the past 48 hour(s)).  Blood Alcohol level:  Lab Results  Component Value Date   ETH <10 09/28/2019   ETH <10 09/14/2019    Metabolic Disorder Labs:  Lab Results  Component Value Date   HGBA1C  5.3 09/17/2019   MPG 105.41 09/17/2019   No results found for: PROLACTIN No results found for: CHOL, TRIG, HDL, CHOLHDL, VLDL, LDLCALC  Current Medications: Current Facility-Administered Medications  Medication Dose Route Frequency Provider Last Rate Last Dose  . acetaminophen (TYLENOL) tablet 650 mg  650 mg Oral Q6H PRN Jackelyn Poling, NP      . alum & mag hydroxide-simeth (MAALOX/MYLANTA) 200-200-20 MG/5ML suspension 30 mL  30 mL Oral Q4H PRN Nira Conn A, NP      . benzocaine (ORAJEL) 10 % mucosal gel   Mouth/Throat QID PRN Nira Conn A, NP      . hydrOXYzine (ATARAX/VISTARIL) tablet 25 mg  25 mg Oral TID PRN Nira Conn A, NP      . ketorolac (TORADOL) 30 MG/ML injection 30 mg  30 mg Intramuscular Once Nira Conn A, NP      . magnesium hydroxide (MILK OF MAGNESIA) suspension 30 mL  30 mL Oral Daily PRN Nira Conn A, NP      . nicotine (NICODERM CQ - dosed in mg/24 hours) patch 21 mg  21 mg Transdermal Daily Nira Conn A, NP      . OLANZapine (ZYPREXA) tablet 10 mg  10 mg Oral QHS Nira Conn  A, NP      . pantoprazole (PROTONIX) EC tablet 40 mg  40 mg Oral Daily Lindon Romp A, NP      . ramelteon (ROZEREM) tablet 8 mg  8 mg Oral QHS Lindon Romp A, NP       PTA Medications: Medications Prior to Admission  Medication Sig Dispense Refill Last Dose  . albuterol (VENTOLIN HFA) 108 (90 Base) MCG/ACT inhaler Inhale 1-2 puffs into the lungs every 6 (six) hours as needed for wheezing or shortness of breath. 6.7 g 0   . divalproex (DEPAKOTE) 500 MG DR tablet Take one tablet (500 mg) by mouth each morning and two tablets (1000 mg) by mouth at bedtime. 90 tablet 0   . nicotine (NICODERM CQ - DOSED IN MG/24 HOURS) 21 mg/24hr patch Place 1 patch (21 mg total) onto the skin daily. 28 patch 0   . OLANZapine (ZYPREXA) 20 MG tablet Take 1 tablet (20 mg total) by mouth at bedtime. 30 tablet 0   . pantoprazole (PROTONIX) 40 MG tablet Take 1 tablet (40 mg total) by mouth daily. 30 tablet 0    . ramelteon (ROZEREM) 8 MG tablet Take 1 tablet (8 mg total) by mouth at bedtime. 30 tablet 0     Musculoskeletal: Strength & Muscle Tone: within normal limits Gait & Station: normal Patient leans: N/A  Psychiatric Specialty Exam: Physical Exam  Constitutional: He is oriented to person, place, and time. He appears well-developed and well-nourished. No distress.  HENT:  Head: Normocephalic and atraumatic.  Right Ear: External ear normal.  Left Ear: External ear normal.  Eyes: Pupils are equal, round, and reactive to light. Right eye exhibits no discharge. Left eye exhibits no discharge.  Respiratory: Effort normal. No respiratory distress.  Musculoskeletal: Normal range of motion.  Neurological: He is alert and oriented to person, place, and time.  Skin: He is not diaphoretic.  Psychiatric: His mood appears anxious. Thought content is not paranoid and not delusional. He expresses impulsivity and inappropriate judgment. He exhibits a depressed mood. He expresses homicidal and suicidal ideation. He expresses suicidal plans. He expresses no homicidal plans.    Review of Systems  Constitutional: Negative for chills, diaphoresis, fever, malaise/fatigue and weight loss.  HENT:       Tooth pain  Respiratory: Negative for cough and shortness of breath.   Gastrointestinal: Negative for diarrhea, nausea and vomiting.  Psychiatric/Behavioral: Positive for depression, substance abuse and suicidal ideas. Negative for hallucinations and memory loss. The patient is nervous/anxious and has insomnia.     Blood pressure 130/87, pulse (!) 102, temperature 98 F (36.7 C), temperature source Oral, resp. rate 20, SpO2 98 %.There is no height or weight on file to calculate BMI.  General Appearance: Disheveled  Eye Contact:  Good  Speech:  Clear and Coherent and Normal Rate  Volume:  Normal  Mood:  Anxious, Depressed, Hopeless, Irritable and Worthless  Affect:  Congruent  Thought Process:  Coherent,  Goal Directed, Linear and Descriptions of Associations: Intact  Orientation:  Full (Time, Place, and Person)  Thought Content:  Logical and Hallucinations: None  Suicidal Thoughts:  Yes.  with intent/plan  Homicidal Thoughts:  Yes.  without intent/plan  Memory:  Immediate;   Good Recent;   Good  Judgement:  Fair  Insight:  Fair  Psychomotor Activity:  Restlessness  Concentration:  Concentration: Fair  Recall:  Good  Fund of Knowledge:  Good  Language:  Good  Akathisia:  Negative  Handed:  Right  AIMS (if indicated):     Assets:  Communication Skills Leisure Time Physical Health  ADL's:  Intact  Cognition:  WNL  Sleep:         Treatment Plan Summary: Daily contact with patient to assess and evaluate symptoms and progress in treatment and Medication management  Observation Level/Precautions:  15 minute checks Laboratory:  CBC Chemistry Profile UDS Psychotherapy: Individual  Medications:   Zyprexa 10 mg QHS for mood stability Rozerem 8 mg QHS for sleep Hydroxyzine 25 mg TID prn for anxiety Toradol 30 mg IM x 1 dose for tooth pain Orajel prn for toothpain Consultations:  Social Work, peer support Discharge Concerns:  Field seismologist, homeless, continued substance abuse Estimated LOS: Other:      Jackelyn Poling, NP 12/5/20203:12 AM

## 2019-10-13 NOTE — Patient Outreach (Signed)
CPSS met with the patient in order to provide substance use recovery support and provide information for substance use recovery resources. Patient reports a history of daily alcohol and cocaine use. Patient states that he does not what specific substance use recovery resource that he is interested in getting connected to most at this time. CPSS still talked to the patient about several different substance use recovery resources as well as provided information for these substance use recovery resources. Some of these resources include residential/outpatient substance use treatment center list, [employer] vacancy list/flier detailing substance use [employer], [employer], and CPSS contact information. CPSS strongly encouraged the patient to follow up with CPSS if needed for further help with substance use recovery resources.

## 2019-10-13 NOTE — Plan of Care (Signed)
[facility] Observation Crisis Plan  Reason for Crisis Plan:  Crisis Stabilization   Plan of Care:  Referral for Inpatient Hospitalization  Family Support:    none  Current Living Environment:  Living Arrangements: Alone, Homeless  Insurance:   Hospital Account    Name Acct ID Class Status Primary Coverage   [place], [place] [id number] [place] MH/DD/SAS - [place]        Guarantor Account (for Hospital Account [id number])    Name Relation to Pt Service Area Active? Acct Type   [name] Catholic Self [facility] Yes Behavioral Health   Address Phone       [place], [place] [address] [phone])          Coverage Information (for Hospital Account [id number])    F/O Payor/Plan Precert #   [facility] FOR MH/DD/SAS/[place] [place] #   Amy, [place] [id number]   Address Phone   [address], [place] [address] [phone]      Legal Guardian:  Legal Guardian: Other:(Self)  Primary Care Provider:  Patient, No Pcp Per  Current Outpatient Providers:  Hampton Abbot, MD  Psychiatrist:  Name of Psychiatrist: None  Counselor/Therapist:  Name of Therapist: None  Compliant with Medications:  No  Additional Information:   Lajoyce Corners 12/5/20206:04 AM

## 2019-10-13 NOTE — Progress Notes (Addendum)
[facility] MD Progress Note  10/13/2019 12:42 PM Ronald Brooks  MRN:  [id number] Subjective: Patient alert and oriented for assessment by nurse practitioner and Dr. Jannifer Franklin.  Patient endorses history of cocaine use.  Patient reports "I went to the convenient store and asked for them to call the police, then the police brought me here."  Patient endorses chronic suicidal ideations, denies plan and intent.  Patient denies homicidal ideations.  Patient denies hallucinations.  Patient has 4 children and is currently homeless in the [place] area.  Patient endorses cocaine use.  Patient endorses interest in outpatient substance use treatment.  Peer support consult ordered.  Patient reports plan to move to [facility], patient states "I feel like things would be better for me there." Principal Problem: Bipolar 1 disorder (HCC) Diagnosis: Principal Problem:   Bipolar 1 disorder (HCC) Active Problems:   Cocaine use disorder (HCC)   Alcohol dependence (HCC)  Total Time spent with patient: 30 minutes  Past Psychiatric History:   Past Medical History:  Past Medical History:  Diagnosis Date  . ADHD   . Asthma   . Bipolar 1 disorder (HCC)    History reviewed. No pertinent surgical history. Family History: History reviewed. No pertinent family history. Family Psychiatric  History: Denies Social History:  Social History   Substance and Sexual Activity  Alcohol Use Yes   Comment: occasional, (2) 24 oz most days when available      Social History   Substance and Sexual Activity  Drug Use Yes  . Types: Cocaine   Comment: has not used cocain and ETOH for 29 days    Social History   Socioeconomic History  . Marital status: Legally Separated    Spouse name: Not on file  . Number of children: Not on file  . Years of education: Not on file  . Highest education level: Not on file  Occupational History  . Not on file  Social Needs  . Financial resource strain: Not on file  . Food insecurity     Worry: Not on file    Inability: Not on file  . Transportation needs    Medical: Not on file    Non-medical: Not on file  Tobacco Use  . Smoking status: Current Every Day Smoker    Packs/day: 0.50    Types: Cigarettes  . Smokeless tobacco: Never Used  Substance and Sexual Activity  . Alcohol use: Yes    Comment: occasional, (2) 24 oz most days when available   . Drug use: Yes    Types: Cocaine    Comment: has not used cocain and ETOH for 29 days  . Sexual activity: Not Currently  Lifestyle  . Physical activity    Days per week: Not on file    Minutes per session: Not on file  . Stress: Not on file  Relationships  . Social Musician on phone: Not on file    Gets together: Not on file    Attends religious service: Not on file    Active member of club or organization: Not on file    Attends meetings of clubs or organizations: Not on file    Relationship status: Not on file  Other Topics Concern  . Not on file  Social History Narrative  . Not on file   Additional Social History:    Pain Medications: Denies abuse Prescriptions: Denies abuse Over the Counter: Denies abuse History of alcohol / drug use?: Yes Longest period of sobriety (  when/how long): Unknown Negative Consequences of Use: Financial, Legal, Personal relationships, Work / School Name of Substance 1: Cocaine 1 - Age of First Use: Adolescent 1 - Amount (size/oz): Varies 1 - Frequency: Daily when available 1 - Duration: Ongoing 1 - Last Use / Amount: 10/12/2019                  Sleep: Good  Appetite:  Good  Current Medications: Current Facility-Administered Medications  Medication Dose Route Frequency Provider Last Rate Last Dose  . acetaminophen (TYLENOL) tablet 650 mg  650 mg Oral Q6H PRN Jackelyn PolingBerry, Jason A, NP      . alum & mag hydroxide-simeth (MAALOX/MYLANTA) 200-200-20 MG/5ML suspension 30 mL  30 mL Oral Q4H PRN Nira ConnBerry, Jason A, NP      . benzocaine (ORAJEL) 10 % mucosal gel    Mouth/Throat QID PRN Nira ConnBerry, Jason A, NP      . hydrOXYzine (ATARAX/VISTARIL) tablet 25 mg  25 mg Oral TID PRN Nira ConnBerry, Jason A, NP   25 mg at 10/13/19 0804  . magnesium hydroxide (MILK OF MAGNESIA) suspension 30 mL  30 mL Oral Daily PRN Nira ConnBerry, Jason A, NP      . nicotine (NICODERM CQ - dosed in mg/24 hours) patch 21 mg  21 mg Transdermal Daily Jackelyn PolingBerry, Jason A, NP   Stopped at 10/13/19 0810  . OLANZapine (ZYPREXA) tablet 10 mg  10 mg Oral QHS Nira ConnBerry, Jason A, NP   10 mg at 10/13/19 0319  . pantoprazole (PROTONIX) EC tablet 40 mg  40 mg Oral Daily Nira ConnBerry, Jason A, NP   40 mg at 10/13/19 0804  . ramelteon (ROZEREM) tablet 8 mg  8 mg Oral QHS Jackelyn PolingBerry, Jason A, NP        Lab Results:  Results for orders placed or performed during the hospital encounter of 10/13/19 (from the past 48 hour(s))  SARS Coronavirus 2 by RT PCR (hospital order, performed in [facility] hospital lab) Nasopharyngeal Nasopharyngeal Swab     Status: None   Collection Time: 10/13/19  1:25 AM   Specimen: Nasopharyngeal Swab  Result Value Ref Range   SARS Coronavirus 2 NEGATIVE NEGATIVE    Comment: (NOTE) SARS-CoV-2 target nucleic acids are NOT DETECTED. The SARS-CoV-2 RNA is generally detectable in upper and lower respiratory specimens during the acute phase of infection. The lowest concentration of SARS-CoV-2 viral copies this assay can detect is 250 copies / mL. A negative result does not preclude SARS-CoV-2 infection and should not be used as the sole basis for treatment or other patient management decisions.  A negative result may occur with improper specimen collection / handling, submission of specimen other than nasopharyngeal swab, presence of viral mutation(s) within the areas targeted by this assay, and inadequate number of viral copies (<250 copies / mL). A negative result must be combined with clinical observations, patient history, and epidemiological information. Fact Sheet for Patients:    BoilerBrush.com.cyhttps://www.fda.gov/media/136312/download Fact Sheet for Healthcare Providers: https://pope.com/https://www.fda.gov/media/136313/download This test is not yet approved or cleared  by the [place] FDA and has been authorized for detection and/or diagnosis of SARS-CoV-2 by FDA under an Emergency Use Authorization (EUA).  This EUA will remain in effect (meaning this test can be used) for the duration of the COVID-19 declaration under Section 564(b)(1) of the Act, 21 U.S.C. section 360bbb-3(b)(1), unless the authorization is terminated or revoked sooner. Performed at [facility], [address] [place], [place] [address]     Blood Alcohol level:  Lab Results  Component Value Date   ETH <10 09/28/2019   ETH <10 17/40/8144    Metabolic Disorder Labs: Lab Results  Component Value Date   HGBA1C 5.3 09/17/2019   MPG 105.41 09/17/2019   No results found for: PROLACTIN No results found for: CHOL, TRIG, HDL, CHOLHDL, VLDL, LDLCALC  Physical Findings: AIMS: Facial and Oral Movements Muscles of Facial Expression: None, normal Lips and Perioral Area: None, normal Jaw: None, normal Tongue: None, normal,Extremity Movements Upper (arms, wrists, hands, fingers): None, normal Lower (legs, knees, ankles, toes): None, normal, Trunk Movements Neck, shoulders, hips: None, normal, Overall Severity Severity of abnormal movements (highest score from questions above): None, normal Incapacitation due to abnormal movements: None, normal Patient's awareness of abnormal movements (rate only patient's report): No Awareness, Dental Status Current problems with teeth and/or dentures?: Yes(dental pain; multiple cavities) Does patient usually wear dentures?: No  CIWA:  CIWA-Ar Total: 0 COWS:  COWS Total Score: 2  Musculoskeletal: Strength & Muscle Tone: within normal limits Gait & Station: normal Patient leans: N/A  Psychiatric Specialty Exam: Physical Exam  Nursing note and vitals  reviewed. Constitutional: He is oriented to person, place, and time. He appears well-developed.  HENT:  Head: Normocephalic.  Cardiovascular: Normal rate.  Respiratory: Effort normal.  Neurological: He is alert and oriented to person, place, and time.  Psychiatric: He has a normal mood and affect. His speech is normal and behavior is normal. Judgment and thought content normal. Cognition and memory are normal.    Review of Systems  Constitutional: Negative.   HENT: Negative.   Eyes: Negative.   Respiratory: Negative.   Cardiovascular: Negative.   Gastrointestinal: Negative.   Genitourinary: Negative.   Musculoskeletal: Negative.   Skin: Negative.   Neurological: Negative.   Endo/Heme/Allergies: Negative.   Psychiatric/Behavioral: Positive for substance abuse.    Blood pressure 137/85, pulse 69, temperature 98 F (36.7 C), temperature source Oral, resp. rate 20, SpO2 98 %.There is no height or weight on file to calculate BMI.  General Appearance: Casual  Eye Contact:  Good  Speech:  Clear and Coherent and Normal Rate  Volume:  Normal  Mood:  Euthymic  Affect:  Appropriate and Congruent  Thought Process:  Coherent, Goal Directed and Descriptions of Associations: Intact  Orientation:  Full (Time, Place, and Person)  Thought Content:  WDL and Logical  Suicidal Thoughts:  Yes.  without intent/plan  Homicidal Thoughts:  No  Memory:  Immediate;   Good Recent;   Good Remote;   Good  Judgement:  Fair  Insight:  Fair  Psychomotor Activity:  Normal  Concentration:  Concentration: Good and Attention Span: Good  Recall:  Good  Fund of Knowledge:  Good  Language:  Good  Akathisia:  No  Handed:  Right  AIMS (if indicated):     Assets:  Communication Skills Desire for Improvement Financial Resources/Insurance Social Support  ADL's:  Intact  Cognition:  WNL  Sleep:        Treatment Plan Summary: Daily contact with patient to assess and evaluate symptoms and progress in  treatment and Medication management  Patient to be observed for 24 hours.  [name], FNP 10/13/2019, 12:42 PM  Patient seen face-to-face for psychiatric evaluation, chart reviewed and case discussed with the physician extender and developed treatment plan. Reviewed the information documented and agree with the treatment plan. Corena Pilgrim, MD

## 2019-10-13 NOTE — Progress Notes (Signed)
Patient ID: Ronald Brooks, male   DOB: [birth year], 43 y.o.   MRN: [id number] Pt is a homeless male who presents for assistance with suicidal ideation. Pt is agitated and irritable at outset. Pt is anxious because he needs medication for his toothache. Pt states he has tried ibuprofen and Aleve but doesn't stop the pain, described as 10/10. Pt says he has "had a bad week" and was having suicidal thoughts. Pt endorses SI but denies HI or AVH. Patient is cooperative with fair eye contact. He refuses to answer some questions but does provide information for most of them. Pt endorses a previous suicide attempt by cutting himself about 1.5 years ago. Pt is A & O and just wants the pain in his mouth to go away.

## 2019-10-13 NOTE — BH Assessment (Signed)
Assessment Note  Ronald Brooks is an 43 y.o. separated male who presents unaccompanied to [facility] reporting symptoms of depression including suicidal ideation with plan to cut himself. Pt has a diagnosis of bipolar disorder and says has been off psychiatric medications for three weeks because he lost them. He says he is homeless and is experiencing severe tooth pain. He is irritable and gave brief responses to questions. He report one previous suicide attempt by cutting himself. He denies problems with sleep or appetite. He says he sometimes has thoughts of hurting people but denies any plan or intent to harm anyone. He denies auditory or visual hallucinations.   Pt reports he uses cocaine daily when available. He says he also drinks 1-2 beers daily when available. He says he rarely uses marijuana. He denies other substance use.  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 He cannot identify any family or friends who are supportive. He says he is on probation for obtaining property under false pretenses. He confirms he was last psychiatrically hospitalized at [facility] 11/07-11/14/20. He reports inpatient treatment at [facility] approximately two months ago, followed by admission to [facility]. He then went to an [facility] in [place]. He has presented to area EDs several times.  Pt does not identify anyone to contact for collateral information.  Pt is somewhat disheveled, alert and oriented x4. Pt speaks in a clear tone, at moderate volume and normal pace. Motor behavior appears normal. Eye contact is fair. Pt's mood is irritable and affect is congruent with mood. Thought process is coherent and relevant. There is no indication Pt is currently responding to internal stimuli or experiencing delusional thought content. Pt states he is willing to sign into a psychiatric facility.   Diagnosis:  F31.4 Bipolar I disorder, Current or most recent episode depressed, Severe F14.20  Cocaine use disorder, Severe   Past Medical History:  Past Medical History:  Diagnosis Date  . ADHD   . Asthma   . Bipolar 1 disorder ([place])     No past surgical history on file.  Family History: No family history on file.  Social History:  reports that he has been smoking cigarettes. He has been smoking about 0.50 packs per day. He has never used smokeless tobacco. He reports current alcohol use. He reports that he does not use drugs.  Additional Social History:  Alcohol / Drug Use Pain Medications: Denies abuse Prescriptions: Denies abuse Over the Counter: Denies abuse History of alcohol / drug use?: Yes Longest period of sobriety (when/how long): Unknown Negative Consequences of Use: Financial, Legal, Personal relationships, Work / School Substance #1 Name of Substance 1: Cocaine 1 - Age of First Use: Adolescent 1 - Amount (size/oz): Varies 1 - Frequency: Daily when available 1 - Duration: Ongoing 1 - Last Use / Amount: 10/12/2019  CIWA: CIWA-Ar BP: 130/87 Pulse Rate: (!) 102 COWS:    Allergies:  Allergies  Allergen Reactions  . Other Shortness Of Breath    "dander"  . Vitamin E Rash    Home Medications: (Not in a hospital admission)   OB/GYN Status:  No LMP for male patient.  General Assessment Data Location of Assessment: [facility] Assessment Services TTS Assessment: In system Is this a Tele or Face-to-Face Assessment?: Face-to-Face Is this an Initial Assessment or a Re-assessment for this encounter?: Initial Assessment Patient Accompanied by:: N/A Language Other than English: No Living Arrangements: Homeless/Shelter What gender do you identify as?: Male Marital status: Separated Elwin Sleight  name: NA Pregnancy Status: No Living Arrangements: Other (Comment)(Homeless) Can pt return to current living arrangement?: Yes Admission Status: Voluntary Is patient capable of signing voluntary admission?: Yes Referral Source: Self/Family/Friend Insurance type:  Self-pay  Medical Screening Exam [facility] Walk-in ONLY) Medical Exam completed: Yes(Jason Allyson Sabal, FNP)  Crisis Care Plan Living Arrangements: Other (Comment)(Homeless) Legal Guardian: Other:(Self) Name of Psychiatrist: None Name of Therapist: None  Education Status Is patient currently in school?: No Highest grade of school patient has completed: Refused to answer Is the patient employed, unemployed or receiving disability?: Unemployed  Risk to self with the past 6 months Suicidal Ideation: Yes-Currently Present Has patient been a risk to self within the past 6 months prior to admission? : Yes Suicidal Intent: Yes-Currently Present Has patient had any suicidal intent within the past 6 months prior to admission? : Yes Is patient at risk for suicide?: Yes Suicidal Plan?: Yes-Currently Present Has patient had any suicidal plan within the past 6 months prior to admission? : Yes Specify Current Suicidal Plan: Plan to cut wrist Access to Means: Yes Specify Access to Suicidal Means: Access to sharps What has been your use of drugs/alcohol within the last 12 months?: Pt reports using cocaine Previous Attempts/Gestures: Yes How many times?: 1 Other Self Harm Risks: Yes Triggers for Past Attempts: Unknown Intentional Self Injurious Behavior: Cutting Comment - Self Injurious Behavior: Last cut 1.5 years ago Family Suicide History: Yes(Uncle died by suicide) Recent stressful life event(s): Financial Problems, Legal Issues, Recent negative physical changes Persecutory voices/beliefs?: No Depression: Yes Depression Symptoms: Despondent, Isolating, Fatigue, Feeling angry/irritable Substance abuse history and/or treatment for substance abuse?: Yes Suicide prevention information given to non-admitted patients: Not applicable  Risk to Others within the past 6 months Homicidal Ideation: No Does patient have any lifetime risk of violence toward others beyond the six months prior to admission? :  Yes (comment) Thoughts of Harm to Others: No Comment - Thoughts of Harm to Others: Pt denies current thoughts of harming others Current Homicidal Intent: No Current Homicidal Plan: No Access to Homicidal Means: No Identified Victim: None History of harm to others?: No Assessment of Violence: None Noted Violent Behavior Description: Pt denies history of violence Does patient have access to weapons?: No Criminal Charges Pending?: No Does patient have a court date: No Is patient on probation?: Yes(Obtaining property under false pretenses)  Psychosis Hallucinations: None noted Delusions: None noted  Mental Status Report Appearance/Hygiene: Disheveled Eye Contact: Fair Motor Activity: Unremarkable Speech: Logical/coherent Level of Consciousness: Alert, Irritable Mood: Irritable Affect: Irritable Anxiety Level: Minimal Thought Processes: Coherent, Relevant Judgement: Partial Orientation: Person, Place, Time, Situation, Appropriate for developmental age Obsessive Compulsive Thoughts/Behaviors: None  Cognitive Functioning Concentration: Normal Memory: Recent Intact, Remote Intact Is patient IDD: No Insight: Fair Impulse Control: Fair Appetite: Good Have you had any weight changes? : No Change Sleep: No Change Total Hours of Sleep: 7 Vegetative Symptoms: Decreased grooming  ADLScreening [facility] Assessment Services) Patient's cognitive ability adequate to safely complete daily activities?: Yes Patient able to express need for assistance with ADLs?: Yes Independently performs ADLs?: Yes (appropriate for developmental age)  Prior Inpatient Therapy Prior Inpatient Therapy: Yes Prior Therapy Dates: Nov 7-14 '20; 08/2018 Prior Therapy Facilty/Provider(s): [facility]; [facility]  Reason for Treatment: Bipolar Disorder  Prior Outpatient Therapy Prior Outpatient Therapy: No Does patient have an ACCT team?: No Does patient have Intensive In-House Services?  : No Does patient have  [facility] services? : No Does patient have P4CC services?: No  ADL Screening (  condition at time of admission) Patient's cognitive ability adequate to safely complete daily activities?: Yes Is the patient deaf or have difficulty hearing?: No Does the patient have difficulty seeing, even when wearing glasses/contacts?: No Does the patient have difficulty concentrating, remembering, or making decisions?: No Patient able to express need for assistance with ADLs?: Yes Does the patient have difficulty dressing or bathing?: No Independently performs ADLs?: Yes (appropriate for developmental age) Does the patient have difficulty walking or climbing stairs?: No Weakness of Legs: None Weakness of Arms/Hands: None  Home Assistive Devices/Equipment Home Assistive Devices/Equipment: None    Abuse/Neglect Assessment (Assessment to be complete while patient is alone) Abuse/Neglect Assessment Can Be Completed: Yes Physical Abuse: Denies Verbal Abuse: Denies Sexual Abuse: Denies Exploitation of patient/patient's resources: Denies     Merchant navy officerAdvance Directives (For Healthcare) Does Patient Have a Medical Advance Directive?: No Would patient like information on creating a medical advance directive?: No - Patient declined          Disposition: Gave clinical report to Nira ConnJason Berry, FNP who completed MSE and determined Pt meets criteria for observation unit. Pt will be observed and evaluated in the morning. Binnie RailJoAnn Glover, [facility] confirmed bed availability.  Disposition Initial Assessment Completed for this Encounter: Yes Disposition of Patient: Admit(Observation unit) Type of inpatient treatment program: (Observation unit) Patient refused recommended treatment: No  On Site Evaluation by:  Nira ConnJason Berry, FNP Reviewed with Physician:    Pamalee LeydenFord Ellis Jaspreet Hollings Jr, [facility], [facility] Triage Specialist [phone]  Patsy BaltimoreWarrick Jr, Harlin RainFord Ellis 10/13/2019 1:15 AM

## 2019-10-14 MED ORDER — RAMELTEON 8 MG PO TABS: 8.0000 mg | ORAL_TABLET | Freq: Every day | ORAL | 0 refills | Status: DC

## 2019-10-14 NOTE — Discharge Summary (Addendum)
Physician Discharge Summary Note  Patient:  Ronald Brooks is an 43 y.o., male MRN:  [id number] DOB:  1976/06/03 Patient phone:  [phone] (home)  Patient address:   [place] [address],  Total Time spent with patient: 30 minutes  Date of Admission:  10/13/2019 Date of Discharge: 10/14/2019  Reason for Admission:    Principal Problem: Bipolar 1 disorder [facility]) Discharge Diagnoses: Principal Problem:   Bipolar 1 disorder (H[facility]) Active Problems:   Cocaine use disorder ([facility])   Alcohol dependence ([facility])   Past Psychiatric History: Substance use disorder  Past Medical History:  Past Medical History:  Diagnosis Date   ADHD    Asthma    Bipolar 1 disorder ([facility])    History reviewed. No pertinent surgical history. Family History: History reviewed. No pertinent family history. Family Psychiatric  History: Denies Social History:  Social History   Substance and Sexual Activity  Alcohol Use Yes   Comment: occasional, (2) 24 oz most days when available      Social History   Substance and Sexual Activity  Drug Use Yes   Types: Cocaine   Comment: has not used cocain and ETOH for 29 days    Social History   Socioeconomic History   Marital status: Legally Separated    Spouse name: Not on file   Number of children: Not on file   Years of education: Not on file   Highest education level: Not on file  Occupational History   Not on file  Social Needs   Financial resource strain: Not on file   Food insecurity    Worry: Not on file    Inability: Not on file   Transportation needs    Medical: Not on file    Non-medical: Not on file  Tobacco Use   Smoking status: Current Every Day Smoker    Packs/day: 0.50    Types: Cigarettes   Smokeless tobacco: Never Used  Substance and Sexual Activity   Alcohol use: Yes    Comment: occasional, (2) 24 oz most days when available    Drug use: Yes    Types: Cocaine    Comment: has not used cocain and ETOH for 29 days    Sexual activity: Not Currently  Lifestyle   Physical activity    Days per week: Not on file    Minutes per session: Not on file   Stress: Not on file  Relationships   Social connections    Talks on phone: Not on file    Gets together: Not on file    Attends religious service: Not on file    Active member of club or organization: Not on file    Attends meetings of clubs or organizations: Not on file    Relationship status: Not on file  Other Topics Concern   Not on file  Social History Narrative   Not on file    Hospital Course:  Patient admitted for observation. Seen by peer support resources, substance use treatment resources given, Patient plans to follow up with outpatient resources. Patient has 4 children, is on probation. Patient complains of tooth pain, will follow up with dentist after discharge.   Physical Findings: AIMS: Facial and Oral Movements Muscles of Facial Expression: None, normal Lips and Perioral Area: None, normal Jaw: None, normal Tongue: None, normal,Extremity Movements Upper (arms, wrists, hands, fingers): None, normal Lower (legs, knees, ankles, toes): None, normal, Trunk Movements Neck, shoulders, hips: None, normal, Overall Severity Severity of abnormal movements (highest score  from questions above): None, normal Incapacitation due to abnormal movements: None, normal Patient's awareness of abnormal movements (rate only patient's report): No Awareness, Dental Status Current problems with teeth and/or dentures?: No Does patient usually wear dentures?: No  CIWA:  CIWA-Ar Total: 0 COWS:  COWS Total Score: 2  Musculoskeletal: Strength & Muscle Tone: within normal limits Gait & Station: normal Patient leans: N/A  Psychiatric Specialty Exam: Physical Exam  Nursing note and vitals reviewed. Constitutional: He is oriented to person, place, and time. He appears well-developed.  HENT:  Head: Normocephalic.  Cardiovascular: Normal rate.  Respiratory:  Effort normal.  Neurological: He is alert and oriented to person, place, and time.  Psychiatric: He has a normal mood and affect. His behavior is normal. Judgment and thought content normal.    Review of Systems  Constitutional: Negative.   HENT: Negative.   Eyes: Negative.   Respiratory: Negative.   Cardiovascular: Negative.   Gastrointestinal: Negative.   Genitourinary: Negative.   Musculoskeletal: Negative.   Skin: Negative.   Neurological: Negative.   Endo/Heme/Allergies: Negative.   Psychiatric/Behavioral: Positive for substance abuse.    Blood pressure (!) 124/93, pulse 83, temperature 98 F (36.7 C), temperature source Oral, resp. rate 18, SpO2 98 %.There is no height or weight on file to calculate BMI.  General Appearance: Casual  Eye Contact:  Good  Speech:  Clear and Coherent and Normal Rate  Volume:  Normal  Mood:  Anxious  Affect:  Appropriate and Congruent  Thought Process:  Coherent, Goal Directed and Descriptions of Associations: Intact  Orientation:  Full (Time, Place, and Person)  Thought Content:  WDL and Logical  Suicidal Thoughts:  Yes.  without intent/plan  Homicidal Thoughts:  No  Memory:  Immediate;   Good Recent;   Good Remote;   Good  Judgement:  Good  Insight:  Fair  Psychomotor Activity:  Normal  Concentration:  Concentration: Good and Attention Span: Good  Recall:  Good  Fund of Knowledge:  Good  Language:  Good  Akathisia:  No  Handed:  Right  AIMS (if indicated):     Assets:  Communication Skills Desire for Improvement Social Support Talents/Skills  ADL's:  Intact  Cognition:  WNL  Sleep:        Have you used any form of tobacco in the last 30 days? (Cigarettes, Smokeless Tobacco, Cigars, and/or Pipes): Yes  Has this patient used any form of tobacco in the last 30 days? (Cigarettes, Smokeless Tobacco, Cigars, and/or Pipes) Yes, Yes, A prescription for an FDA-approved tobacco cessation medication was offered at discharge and the  patient refused  Blood Alcohol level:  Lab Results  Component Value Date   [facility] <10 09/28/2019   ETH <10 60/63/0160    Metabolic Disorder Labs:  Lab Results  Component Value Date   HGBA1C 5.3 09/17/2019   MPG 105.41 09/17/2019   No results found for: PROLACTIN No results found for: CHOL, TRIG, HDL, CHOLHDL, VLDL, LDLCALC  See Psychiatric Specialty Exam and Suicide Risk Assessment completed by Attending Physician prior to discharge.  Discharge destination:  Home  Is patient on multiple antipsychotic therapies at discharge:  No   Has Patient had three or more failed trials of antipsychotic monotherapy by history:  No  Recommended Plan for Multiple Antipsychotic Therapies: NA    Allergies as of 10/14/2019       Reactions   Other Shortness Of Breath   "dander"   Vitamin E Rash  Medication List     TAKE these medications      Indication  albuterol 108 (90 Base) MCG/ACT inhaler Commonly known as: VENTOLIN HFA Inhale 1-2 puffs into the lungs every 6 (six) hours as needed for wheezing or shortness of breath.  Indication: Asthma   divalproex 500 MG DR tablet Commonly known as: DEPAKOTE Take one tablet (500 mg) by mouth each morning and two tablets (1000 mg) by mouth at bedtime.  Indication: Depressive Phase of Manic-Depression   OLANZapine 20 MG tablet Commonly known as: ZYPREXA Take 1 tablet (20 mg total) by mouth at bedtime.  Indication: Depressive Phase of Manic-Depression   ramelteon 8 MG tablet Commonly known as: ROZEREM Take 1 tablet (8 mg total) by mouth at bedtime.  Indication: Trouble Sleeping          Follow-up recommendations:  Other:  Follow up with outpatient resources  Comments:  Discharge  Signed: Patrcia Dollyina L Tate, FNP 10/14/2019, 2:43 PM Patient seen face-to-face for psychiatric evaluation, chart reviewed and case discussed with the physician extender and developed treatment plan. Reviewed the information documented and agree with the  treatment plan. Thedore MinsMojeed Marrion Accomando, MD

## 2019-10-14 NOTE — BHH Suicide Risk Assessment (Cosign Needed)
Suicide Risk Assessment  Discharge Assessment   [facility] Discharge Suicide Risk Assessment   Principal Problem: Bipolar 1 disorder [facility]) Discharge Diagnoses: Principal Problem:   Bipolar 1 disorder ([place]) Active Problems:   Cocaine use disorder ([place])   Alcohol dependence ([place])   Total Time spent with patient: 30 minutes  Musculoskeletal: Strength & Muscle Tone: within normal limits Gait & Station: normal Patient leans: N/A  Psychiatric Specialty Exam:   Blood pressure (!) 124/93, pulse 83, temperature 98 F (36.7 C), temperature source Oral, resp. rate 18, SpO2 98 %.There is no height or weight on file to calculate BMI.  General Appearance: Casual  Eye Contact::  Good  Speech:  Clear and Coherent and Normal Rate  Volume:  Normal  Mood:  Anxious  Affect:  Appropriate and Congruent  Thought Process:  Coherent, Goal Directed and Descriptions of Associations: Intact  Orientation:  Full (Time, Place, and Person)  Thought Content:  WDL and Logical  Suicidal Thoughts:  Yes.  without intent/plan  Homicidal Thoughts:  No  Memory:  Immediate;   Good Recent;   Good Remote;   Good  Judgement:  Good  Insight:  Good  Psychomotor Activity:  Normal  Concentration:  Good  Recall:  Good  Fund of Knowledge:Good  Language: Good  Akathisia:  Yes  Handed:  Right  AIMS (if indicated):     Assets:  Communication Skills Desire for Improvement Physical Health Social Support  Sleep:     Cognition: WNL  ADL's:  Intact   Mental Status Per Nursing Assessment::   On Admission:  Suicidal ideation indicated by patient  Demographic Factors:  Male and Caucasian  Loss Factors: NA  Historical Factors: NA  Risk Reduction Factors:   Living with another person, especially a relative, Positive social support, Positive therapeutic relationship and Positive coping skills or problem solving skills  Continued Clinical Symptoms:  Alcohol/Substance Abuse/Dependencies  Cognitive Features That  Contribute To Risk:  None    Suicide Risk:  Minimal: No identifiable suicidal ideation.  Patients presenting with no risk factors but with morbid ruminations; may be classified as minimal risk based on the severity of the depressive symptoms    Plan Of Care/Follow-up recommendations:  Other:  Follow up with outpatient resources  [name], FNP 10/14/2019, 2:40 PM

## 2019-10-14 NOTE — Progress Notes (Signed)
Patient ID: Ronald Brooks, male   DOB: 02/01/1976, 43 y.o.   MRN: [id number] Pt lying in bed at the beginning of this shift asleep; easily awaken by verbal stimuli. Denies SI, HI, AVH at this time: 2145 No apparent distress. Pt contracted for safety. Pt is alert and oriented x 3. Pt calm and cooperative with assessment. Will continue to monitor q 15 mins for safety.

## 2019-10-14 NOTE — Progress Notes (Signed)
Patient ID: Ronald Brooks, male   DOB: [birth year], 43 y.o.   MRN: [id number] Patient denies SI/HI/AVH, educated on his discharge instructions and verbalizes understanding.  Pt given all of his belongings and discharge instructions, refused to sign his discharge paper work, and refused to take his AVS. Pt left the hospital with all of his personal belongings.

## 2020-02-12 ENCOUNTER — Telehealth (INDEPENDENT_AMBULATORY_CARE_PROVIDER_SITE_OTHER): Payer: Self-pay | Admitting: Primary Care

## 2020-06-05 ENCOUNTER — Inpatient Hospital Stay (HOSPITAL_COMMUNITY)
Admission: RE | Admit: 2020-06-05 | Discharge: 2020-06-09 | DRG: 882 | Disposition: A | Discharge status: Home / Self Care | Payer: Federal, State, Local not specified - Other | Attending: Psychiatry | Admitting: Psychiatry

## 2020-06-05 DIAGNOSIS — F319 Bipolar disorder, unspecified: Secondary | ICD-10-CM | POA: Diagnosis present

## 2020-06-05 DIAGNOSIS — F17210 Nicotine dependence, cigarettes, uncomplicated: Secondary | ICD-10-CM | POA: Diagnosis present

## 2020-06-05 DIAGNOSIS — F0634 Mood disorder due to known physiological condition with mixed features: Secondary | ICD-10-CM | POA: Diagnosis present

## 2020-06-05 DIAGNOSIS — F909 Attention-deficit hyperactivity disorder, unspecified type: Secondary | ICD-10-CM | POA: Diagnosis present

## 2020-06-05 DIAGNOSIS — Z79899 Other long term (current) drug therapy: Secondary | ICD-10-CM

## 2020-06-05 DIAGNOSIS — R45851 Suicidal ideations: Secondary | ICD-10-CM | POA: Diagnosis present

## 2020-06-05 DIAGNOSIS — Z20822 Contact with and (suspected) exposure to covid-19: Secondary | ICD-10-CM | POA: Diagnosis present

## 2020-06-05 DIAGNOSIS — Z9109 Other allergy status, other than to drugs and biological substances: Secondary | ICD-10-CM | POA: Diagnosis not present

## 2020-06-05 DIAGNOSIS — J45909 Unspecified asthma, uncomplicated: Secondary | ICD-10-CM | POA: Diagnosis present

## 2020-06-05 DIAGNOSIS — F3163 Bipolar disorder, current episode mixed, severe, without psychotic features: Secondary | ICD-10-CM

## 2020-06-05 DIAGNOSIS — F1411 Cocaine abuse, in remission: Secondary | ICD-10-CM | POA: Diagnosis present

## 2020-06-05 DIAGNOSIS — F4310 Post-traumatic stress disorder, unspecified: Principal | ICD-10-CM | POA: Diagnosis present

## 2020-06-05 DIAGNOSIS — Z888 Allergy status to other drugs, medicaments and biological substances status: Secondary | ICD-10-CM | POA: Diagnosis not present

## 2020-06-05 LAB — SARS CORONAVIRUS 2 BY RT PCR (HOSPITAL ORDER, PERFORMED IN ~~LOC~~ HOSPITAL LAB): SARS Coronavirus 2: NEGATIVE

## 2020-06-05 MED ORDER — ALUM & MAG HYDROXIDE-SIMETH 200-200-20 MG/5ML PO SUSP
30.0000 mL | ORAL | Status: DC | PRN
  Filled 2020-06-05: qty 30

## 2020-06-05 MED ORDER — MAGNESIUM HYDROXIDE 400 MG/5ML PO SUSP
30.0000 mL | Freq: Every day | ORAL | Status: DC | PRN
  Filled 2020-06-05: qty 30

## 2020-06-05 MED ORDER — NICOTINE 21 MG/24HR TD PT24
21.0000 mg | MEDICATED_PATCH | Freq: Every day | TRANSDERMAL | Status: DC
  Administered 2020-06-06 – 2020-06-09 (×4): 21 mg via TRANSDERMAL
  Filled 2020-06-05 (×8): qty 1

## 2020-06-05 MED ORDER — HYDROXYZINE HCL 25 MG PO TABS
25.0000 mg | ORAL_TABLET | Freq: Three times a day (TID) | ORAL | Status: DC | PRN
  Administered 2020-06-05 – 2020-06-08 (×5): 25 mg via ORAL
  Filled 2020-06-05 (×4): qty 1
  Filled 2020-06-05: qty 20
  Filled 2020-06-05 (×2): qty 1

## 2020-06-05 MED ORDER — NICOTINE POLACRILEX 2 MG MT GUM
2.0000 mg | CHEWING_GUM | OROMUCOSAL | Status: DC | PRN
  Administered 2020-06-05: 2 mg via ORAL

## 2020-06-05 MED ORDER — TRAZODONE HCL 50 MG PO TABS
50.0000 mg | ORAL_TABLET | Freq: Every evening | ORAL | Status: DC | PRN
  Administered 2020-06-05: 50 mg via ORAL
  Filled 2020-06-05 (×2): qty 1

## 2020-06-05 MED ORDER — ACETAMINOPHEN 325 MG PO TABS
650.0000 mg | ORAL_TABLET | Freq: Four times a day (QID) | ORAL | Status: DC | PRN
  Administered 2020-06-07 – 2020-06-09 (×5): 650 mg via ORAL
  Filled 2020-06-05 (×6): qty 2
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
## 2020-06-05 NOTE — H&P (Signed)
Behavioral Health Medical Screening Exam  Ronald Brooks is a 44 y.o. Caucasian male with hx of mental illness & polysubstance use disorders. Has been previously a patient here at [facility] for mood instability & substance abuse related issues. He is currently receiving mental health care & substance abuse treatments on an outpatient basis with the [facility]. He walked in here to the [facility] with his clinician from the [facility] seeking mental health stabilization due to worsening sypptoms (suicidal ideations with plans to cut himself or overdose). During this medical screening assessment, Ronald Brooks reports, "My medicines are no longer working. I have been on Depakote & Zyprexa. I'm taking them for the most part as I was instructed. I'm feeling a lot of stress & pressure from work. 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 I'm dealing with an upcoming court date for probation violation because I had a dirty urine on one of my drug testing day. I have not been sleeping well. I feel like I'm not going to make it without help. I have been sober from drugs & alcohol x 146 days. I need help please. Don't send out there on my own please. Do something".  Total Time spent with patient: 25 minutes  Psychiatric Specialty Exam: Physical Exam Vitals and nursing note reviewed.  HENT:     Head: Normocephalic.     Nose: Nose normal.     Mouth/Throat:     Pharynx: Oropharynx is clear.  Eyes:     Pupils: Pupils are equal, round, and reactive to light.  Cardiovascular:     Rate and Rhythm: Normal rate.     Pulses: Normal pulses.  Pulmonary:     Effort: Pulmonary effort is normal.  Genitourinary:    Comments: Deferred Musculoskeletal:        General: Normal range of motion.     Cervical back: Normal range of motion.  Skin:    General: Skin is warm and dry.  Neurological:     Mental Status: He is alert and oriented to person, place, and time.    Review of Systems   Constitutional: Negative for chills, diaphoresis and fever.  HENT: Negative for congestion, rhinorrhea, sneezing and sore throat.   Eyes: Negative for discharge.  Respiratory: Negative for cough, chest tightness, shortness of breath and wheezing.   Cardiovascular: Negative for chest pain and palpitations.  Gastrointestinal: Negative for diarrhea, nausea and vomiting.  Endocrine: Negative for cold intolerance.  Genitourinary: Negative for difficulty urinating.  Musculoskeletal: Negative for arthralgias and myalgias.  Skin: Negative.   Allergic/Immunologic: Positive for environmental allergies (Danda). Negative for food allergies and immunocompromised state.       Allergies: Vitamin E  Neurological: Negative for dizziness, tremors, seizures, syncope, light-headedness and headaches.  Psychiatric/Behavioral: Positive for agitation, decreased concentration, dysphoric mood, sleep disturbance and suicidal ideas. Negative for behavioral problems, confusion, hallucinations and self-injury. The patient is nervous/anxious. The patient is not hyperactive.    Blood pressure 111/74, pulse 101, temperature 98.4 F (36.9 C), temperature source Oral, resp. rate 18, height 5\' 11"  (1.803 m), weight (!) 97.5 kg, SpO2 97 %.Body mass index is 29.99 kg/m. General Appearance: Disheveled Eye Contact:  Fair Speech:  Clear and Coherent and Normal Rate Volume:  Normal Mood:  Anxious, Depressed and Dysphoric Affect:  Congruent and Labile Thought Process:  Coherent and Descriptions of Associations: Intact Orientation:  Full (Time, Place, and Person) Thought Content:  Logical and Rumination Suicidal Thoughts:  Yes.  with intent/plan to cut himself or over dose Homicidal Thoughts:  Yes, without plans or intent. Memory:  Immediate;   Good Recent;   Good Remote;   Good Judgement:  Fair Insight:  Fair Psychomotor Activity:  Restlessness Concentration: Concentration: Poor and Attention Span: Poor Recall:   Fair Fund of Knowledge:Fair Language: Good Akathisia:  Negative Handed:  Right AIMS (if indicated):    Assets:  Communication Skills Desire for Improvement Social Support Sleep:  Number of Hours: 4.5  Musculoskeletal: Strength & Muscle Tone: within normal limits Gait & Station: normal Patient leans: N/A  Blood pressure 111/74, pulse 101, temperature 98.4 F (36.9 C), temperature source Oral, resp. rate 18, height 5\' 11"  (1.803 m), weight (!) 97.5 kg, SpO2 97 %.  Recommendations: Inpatient hospitalizations recommended for mood stabilization. Will order the necessary labs.  Based on my evaluation the patient does not appear to have an emergency medical condition.  , NP, PMHNP, FNP-BC 06/06/2020, 9:43 AM

## 2020-06-06 ENCOUNTER — Encounter (HOSPITAL_COMMUNITY): Payer: Self-pay | Admitting: Psychiatry

## 2020-06-06 ENCOUNTER — Other Ambulatory Visit: Payer: Self-pay

## 2020-06-06 DIAGNOSIS — F3163 Bipolar disorder, current episode mixed, severe, without psychotic features: Secondary | ICD-10-CM

## 2020-06-06 LAB — URINALYSIS, ROUTINE W REFLEX MICROSCOPIC
Bilirubin Urine: NEGATIVE
Glucose, UA: NEGATIVE mg/dL
Hgb urine dipstick: NEGATIVE
Ketones, ur: 5 mg/dL — AB
Leukocytes,Ua: NEGATIVE
Nitrite: NEGATIVE
Protein, ur: NEGATIVE mg/dL
Specific Gravity, Urine: 1.019 (ref 1.005–1.030)
pH: 6 (ref 5.0–8.0)

## 2020-06-06 LAB — HEMOGLOBIN A1C
Hgb A1c MFr Bld: 5.4 % (ref 4.8–5.6)
Mean Plasma Glucose: 108.28 mg/dL

## 2020-06-06 LAB — CBC
HCT: 45 % (ref 39.0–52.0)
Hemoglobin: 15.2 g/dL (ref 13.0–17.0)
MCH: 30.1 pg (ref 26.0–34.0)
MCHC: 33.8 g/dL (ref 30.0–36.0)
MCV: 89.1 fL (ref 80.0–100.0)
Platelets: 214 10*3/uL (ref 150–400)
RBC: 5.05 MIL/uL (ref 4.22–5.81)
RDW: 13.1 % (ref 11.5–15.5)
WBC: 7.3 10*3/uL (ref 4.0–10.5)
nRBC: 0 % (ref 0.0–0.2)

## 2020-06-06 LAB — COMPREHENSIVE METABOLIC PANEL
ALT: 21 U/L (ref 0–44)
AST: 19 U/L (ref 15–41)
Albumin: 3.9 g/dL (ref 3.5–5.0)
Alkaline Phosphatase: 48 U/L (ref 38–126)
Anion gap: 8 (ref 5–15)
BUN: 15 mg/dL (ref 6–20)
CO2: 26 mmol/L (ref 22–32)
Calcium: 9.4 mg/dL (ref 8.9–10.3)
Chloride: 102 mmol/L (ref 98–111)
Creatinine, Ser: 0.87 mg/dL (ref 0.61–1.24)
GFR calc Af Amer: >60 mL/min (ref >60)
GFR calc non Af Amer: >60 mL/min (ref >60)
Glucose, Bld: 90 mg/dL (ref 70–99)
Potassium: 4.4 mmol/L (ref 3.5–5.1)
Sodium: 136 mmol/L (ref 135–145)
Total Bilirubin: 0.7 mg/dL (ref 0.3–1.2)
Total Protein: 7.2 g/dL (ref 6.5–8.1)

## 2020-06-06 LAB — RAPID URINE DRUG SCREEN, HOSP PERFORMED
Amphetamines: NOT DETECTED
Barbiturates: NOT DETECTED
Benzodiazepines: NOT DETECTED
Cocaine: NOT DETECTED
Opiates: NOT DETECTED
Tetrahydrocannabinol: NOT DETECTED

## 2020-06-06 LAB — ETHANOL: Alcohol, Ethyl (B): <10 mg/dL (ref <10)

## 2020-06-06 LAB — VALPROIC ACID LEVEL: Valproic Acid Lvl: 35 ug/mL — ABNORMAL LOW (ref 50.0–100.0)

## 2020-06-06 LAB — LIPID PANEL
Cholesterol: 232 mg/dL — ABNORMAL HIGH (ref 0–200)
HDL: 41 mg/dL (ref >40)
LDL Cholesterol: 152 mg/dL — ABNORMAL HIGH (ref 0–99)
Total CHOL/HDL Ratio: 5.7 RATIO
Triglycerides: 194 mg/dL — ABNORMAL HIGH (ref <150)
VLDL: 39 mg/dL (ref 0–40)

## 2020-06-06 LAB — TSH: TSH: 4.302 u[IU]/mL (ref 0.350–4.500)

## 2020-06-06 MED ORDER — LORAZEPAM 1 MG PO TABS: 1.0000 mg | ORAL_TABLET | ORAL | Status: DC | PRN

## 2020-06-06 MED ORDER — CARBAMAZEPINE 200 MG PO TABS
200.0000 mg | ORAL_TABLET | Freq: Two times a day (BID) | ORAL | Status: DC
  Administered 2020-06-06 – 2020-06-09 (×6): 200 mg via ORAL
  Filled 2020-06-06 (×2): qty 28
  Filled 2020-06-06 (×2): qty 1
  Filled 2020-06-06: qty 28
  Filled 2020-06-06 (×2): qty 1
  Filled 2020-06-06: qty 28
  Filled 2020-06-06 (×5): qty 1

## 2020-06-06 MED ORDER — OLANZAPINE 10 MG PO TBDP: 10.0000 mg | ORAL_TABLET | Freq: Three times a day (TID) | ORAL | Status: DC | PRN

## 2020-06-06 MED ORDER — TRAZODONE HCL 50 MG PO TABS
50.0000 mg | ORAL_TABLET | Freq: Once | ORAL | Status: AC
  Administered 2020-06-06: 50 mg via ORAL
  Filled 2020-06-06 (×2): qty 1

## 2020-06-06 MED ORDER — ZIPRASIDONE MESYLATE 20 MG IM SOLR: 20.0000 mg | INTRAMUSCULAR | Status: DC | PRN

## 2020-06-06 MED ORDER — QUETIAPINE FUMARATE 50 MG PO TABS
50.0000 mg | ORAL_TABLET | Freq: Two times a day (BID) | ORAL | Status: DC
  Administered 2020-06-06 – 2020-06-09 (×6): 50 mg via ORAL
  Filled 2020-06-06 (×4): qty 1
  Filled 2020-06-06: qty 28
  Filled 2020-06-06: qty 1
  Filled 2020-06-06 (×2): qty 28
  Filled 2020-06-06: qty 1
  Filled 2020-06-06: qty 28
  Filled 2020-06-06 (×3): qty 1

## 2020-06-06 NOTE — Progress Notes (Signed)
Recreation Therapy Notes  Date: 7.30.21 Time: 0930 Location: 300 Hall Dayroom  Group Topic: Stress Management  Goal Area(s) Addresses:  Patient will identify positive stress management techniques. Patient will identify benefits of using stress management post d/c.  Intervention: Stress Management  Activity:  Meditation.  LRT played a meditation that focused on making choices.  Patients were to listen and follow along as meditation was played to engage in activity.    Education:  Stress Management, Discharge Planning.   Education Outcome: Acknowledges Education  Clinical Observations/Feedback: Pt did not attend group activity.    Caroll Rancher, LRT/CTRS         Caroll Rancher A 06/06/2020 11:25 AM

## 2020-06-06 NOTE — Progress Notes (Addendum)
Pt has been restless and fidgety. His PRN vistaril was administered at 2137 and his trazadone at 2139 without any effectiveness. Pt came to the nurses station earlier during the night to ask for some crackers and was provided a drink/snack. Pt was loud at the time and was educated to speak in a lower tone of voice politely since it was night time for all patients. Pt verbally demonstrated understanding. Pt came up to the nurses station later again reporting trouble sleeping. NP, Adaku Anike was notified anf ordered a one time dose of 50 mg of trazadone po which was administered at 0116. Pt is currently resting in bed. Q 15 min safety checks continue. Pt's safety has been maintained.    06/05/20 2015  Psych Admission Type (Psych Patients Only)  Admission Status Voluntary  Psychosocial Assessment  Patient Complaints Agitation;Anxiety;Appetite decrease;Crying spells;Depression;Hopelessness;Hyperactivity;Insomnia;Loneliness;Nervousness;Restlessness;Sadness;Irritability;Worrying;Tension  Eye Contact Fair  Facial Expression Animated;Anxious  Affect Anxious;Irritable  Speech Pressured;Logical/coherent  Interaction Assertive;Arrogant  Motor Activity Fidgety;Restless  Appearance/Hygiene In hospital gown  Behavior Characteristics Cooperative;Anxious;Fidgety  Mood Depressed;Anxious;Irritable  Aggressive Behavior  Targets Property;Other (Comment) (put down dog that was damaging his property)  Type of Behavior Provoked or triggered  Effect Animals harmed  Thought Process  Coherency WDL  Content WDL  Delusions None reported or observed  Perception WDL  Hallucination None reported or observed  Judgment Poor  Confusion None  Danger to Self  Current suicidal ideation? Passive  Self-Injurious Behavior No self-injurious ideation or behavior indicators observed or expressed   Agreement Not to Harm Self Yes  Description of Agreement verbally contracts for safety  Danger to Others  Danger to Others  None reported or observed

## 2020-06-06 NOTE — [facility] Suicide Risk Assessment (Signed)
[facility] INPATIENT:  Family/Significant Other Suicide Prevention Education  Suicide Prevention Education:  Patient Refusal for Family/Significant Other Suicide Prevention Education: The patient Ronald Brooks has refused to provide written consent for family/significant other to be provided Family/Significant Other Suicide Prevention Education during admission and/or prior to discharge.  Physician notified.  SPE completed with pt, as pt refused to consent to family contact. SPI pamphlet provided to pt and pt was encouraged to share information with support network, ask questions, and talk about any concerns relating to SPE. Pt denies access to guns/firearms and verbalized understanding of information provided. Mobile Crisis information also provided to pt.    [name] 06/06/2020, 10:23 AM
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
## 2020-06-06 NOTE — [facility] Counselor (Signed)
Adult Comprehensive Assessment  Patient ID: Ronald Brooks, male   DOB: [birth year], 44 y.o.   MRN: [id number]  Information Source: Information source: Patient   Current Stressors:  Patient states their primary concerns and needs for treatment are:: "I was suicidal.  I still am." Patient states their goals for this hospitilization and ongoing recovery are:: "get my meds striaight." Educational / Learning stressors: Patient refused to answer. Employment / Job issues: Patient refused to answer. Family Relationships: Patient refused to answer. Financial / Lack of resources (include bankruptcy): Refused to answer Housing / Lack of housing: Patient refused to answer. Physical health (include injuries & life threatening diseases): Patient refused to answer. Social relationships: Refused to answer Substance abuse: Refused to answer Bereavement / Loss: Refused to answer  Living/Environment/Situation:  Living Arrangements: Other (Comment) (Pt reports that he is a resident of [facility].) Who else lives in the home?: Other residents of [facility]. How long has patient lived in current situation?: "140 days"  Family History:  What is your sexual orientation?: Refused to answer Has your sexual activity been affected by drugs, alcohol, medication, or emotional stress?: Refused to answer   Childhood History:  Additional childhood history information: Refused to answer Description of patient's relationship with caregiver when they were a child: Refused to answer Patient's description of current relationship with people who raised him/her: Refused to answer How were you disciplined when you got in trouble as a child/adolescent?: Refused to answer   Education:  Highest grade of school patient has completed: Refused to answer   Employment/Work Situation:   Employment situation: Employed 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    Alcohol/Substance Abuse:   What has been your use of drugs/alcohol within the last 12 months?: Refused to answer If yes, describe treatment: Refused to answer   Social Support System:   Describe Community Support System: "[facility]" Type of faith/religion: "Christian" How does patient's faith help to cope with current illness?: "sometimes, yes"   Leisure/Recreation:   Leisure and Hobbies: Refused to answer   Strengths/Needs:   What is the patient's perception of their strengths?: Refused to answer Patient states they can use these personal strengths during their treatment to contribute to their recovery: Refused to answer Patient states these barriers may affect/interfere with their treatment: Refused to answer Patient states these barriers may affect their return to the community: Refused to answer Other important information patient would like considered in planning for their treatment: Refused to answer   Discharge Plan:   Currently receiving community mental health services: Yes ([facility]) Patient states concerns and preferences for aftercare planning are: Pt reports plans to continue with [facility].   Patient states they will know when they are safe and ready for discharge when: "I don't know" Patient description of barriers related to discharge medications: No insurance Will patient be returning to same living situation after discharge?: Pt reports that he will return to [facility].   Summary/Recommendations:   Summary and Recommendations (to be completed by the evaluator): Patient is a 44 year old male from [facility] [place]). He presents to [facility] voluntarily with his caseworker at [facility], seeking inpatient for ongoing suicidal ideation.   Patient declined to answer majority  of assessment questions and was aggressive in body language and tone towards staff.  He reports that he is seeking medications for his mental health needs.  He has a primary diagnosis of Bipolar I Disorder.  While here, he can benefit from crisis stabilization, medication management, therapeutic milieu, and referrals for services.  [name]. 06/06/2020

## 2020-06-06 NOTE — Progress Notes (Signed)
   06/05/20 2015  COVID-19 Daily Checkoff  Have you had a fever (temp > 37.80C/100F)  in the past 24 hours?  No  COVID-19 EXPOSURE  Have you traveled outside the state in the past 14 days? No  Have you been in contact with someone with a confirmed diagnosis of COVID-19 or PUI in the past 14 days without wearing appropriate PPE? No  Have you been living in the same home as a person with confirmed diagnosis of COVID-19 or a PUI (household contact)? No  Have you been diagnosed with COVID-19? No

## 2020-06-06 NOTE — Tx Team (Cosign Needed)
Interdisciplinary Treatment and Diagnostic Plan Update  06/06/2020 Time of Session: 1145am Ronald Brooks MRN: [id number]  Principal Diagnosis: <principal problem not specified>  Secondary Diagnoses: Active Problems:   Bipolar affective disorder, currently active [facility])   Current Medications:  Current Facility-Administered Medications  Medication Dose Route Frequency Provider Last Rate Last Admin  . acetaminophen (TYLENOL) tablet 650 mg  650 mg Oral Q6H PRN Nwoko, Herbert Pun I, NP      . alum & mag hydroxide-simeth (MAALOX/MYLANTA) 200-200-20 MG/5ML suspension 30 mL  30 mL Oral Q4H PRN Nwoko, Agnes I, NP      . hydrOXYzine (ATARAX/VISTARIL) tablet 25 mg  25 mg Oral TID PRN Lindell Spar I, NP   25 mg at 06/05/20 2137  . magnesium hydroxide (MILK OF MAGNESIA) suspension 30 mL  30 mL Oral Daily PRN Nwoko, Agnes I, NP      . nicotine (NICODERM CQ - dosed in mg/24 hours) patch 21 mg  21 mg Transdermal Q0600 Lindell Spar I, NP   21 mg at 06/06/20 0814  . nicotine polacrilex (NICORETTE) gum 2 mg  2 mg Oral PRN Anike, Adaku C, NP   2 mg at 06/05/20 2138  . traZODone (DESYREL) tablet 50 mg  50 mg Oral QHS PRN Lindell Spar I, NP   50 mg at 06/05/20 2139   PTA Medications: Medications Prior to Admission  Medication Sig Dispense Refill Last Dose  . albuterol (VENTOLIN HFA) 108 (90 Base) MCG/ACT inhaler Inhale 1-2 puffs into the lungs every 6 (six) hours as needed for wheezing or shortness of breath. 6.7 g 0   . divalproex (DEPAKOTE) 500 MG DR tablet Take one tablet (500 mg) by mouth each morning and two tablets (1000 mg) by mouth at bedtime. 90 tablet 0   . loratadine (CLARITIN) 10 MG tablet Take 10 mg by mouth daily.     Marland Kitchen ZYPREXA 10 MG tablet Take 10 mg by mouth at bedtime.      . ramelteon (ROZEREM) 8 MG tablet Take 1 tablet (8 mg total) by mouth at bedtime. 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   Physician Treatment Plan for Primary Diagnosis: <principal problem not specified> Long Term Goal(s):     Short Term Goals:    Medication Management: Evaluate patient's response, side effects, and tolerance of medication regimen.  Therapeutic Interventions: 1 to 1 sessions, Unit Group sessions and Medication administration.  Evaluation of Outcomes: Not Met  Physician Treatment Plan for Secondary Diagnosis: Active Problems:   Bipolar affective disorder, currently active ([facility])  Long Term Goal(s):     Short Term Goals:       Medication Management: Evaluate patient's response, side effects, and tolerance of medication regimen.  Therapeutic Interventions: 1 to 1 sessions, Unit Group sessions and Medication administration.  Evaluation of Outcomes: Not Met   RN Treatment Plan for Primary Diagnosis: <principal problem not specified> Long Term Goal(s): Knowledge of disease and therapeutic regimen to maintain health will improve  Short Term Goals: Ability to remain free from injury will improve, Ability to verbalize frustration and anger appropriately will improve, Ability to demonstrate self-control, Ability to participate in decision making will improve, Ability to verbalize feelings will improve, Ability to disclose and discuss suicidal  ideas, Ability to identify and develop effective coping behaviors will improve and Compliance with prescribed medications will improve  Medication Management: RN will administer medications as ordered by provider, will assess and evaluate patient's response and provide education to patient for prescribed medication. RN will report any adverse and/or side effects to prescribing provider.  Therapeutic  Interventions: 1 on 1 counseling sessions, Psychoeducation, Medication administration, Evaluate responses to treatment, Monitor vital signs and CBGs as ordered, Perform/monitor CIWA, COWS, AIMS and Fall Risk screenings as ordered, Perform wound care treatments as ordered.  Evaluation of Outcomes: Not Met   LCSW Treatment Plan for Primary Diagnosis: <principal problem not specified> Long Term Goal(s): Safe transition to appropriate next level of care at discharge, Engage patient in therapeutic group addressing interpersonal concerns.  Short Term Goals: Engage patient in aftercare planning with referrals and resources, Increase social support, Increase ability to appropriately verbalize feelings, Increase emotional regulation, Facilitate acceptance of mental health diagnosis and concerns, Facilitate patient progression through stages of change regarding substance use diagnoses and concerns, Identify triggers associated with mental health/substance abuse issues and Increase skills for wellness and recovery  Therapeutic Interventions: Assess for all discharge needs, 1 to 1 time with Social worker, Explore available resources and support systems, Assess for adequacy in community support network, Educate family and significant other(s) on suicide prevention, Complete Psychosocial Assessment, Interpersonal group therapy.  Evaluation of Outcomes: Not Met   Progress in Treatment: Attending groups: Yes. and No. Participating in groups: No. Taking medication as prescribed: Yes. Toleration medication: Yes. Family/Significant other contact made: No, will contact:  family Patient understands diagnosis: Yes. Discussing patient identified problems/goals with staff: Yes. Medical problems stabilized or resolved: Yes. Denies suicidal/homicidal ideation: No. Denies HI but endorses SI Issues/concerns per patient self-inventory: No. Other:  New problem(s) identified: No, Describe:  no  New Short Term/Long  Term Goal(s):  Patient Goals:  "to not be suicidal"   Discharge Plan or Barriers:   Reason for Continuation of Hospitalization: Medication stabilization  Estimated Length of Stay:  Attendees: Patient: Ronald Brooks 06/06/2020 1:21 PM  Physician: Neita Garnet 06/06/2020 1:21 PM  Nursing:  06/06/2020 1:21 PM  RN Care Manager: 06/06/2020 1:21 PM  Social Worker:  Macon Large 06/06/2020 1:21 PM  Recreational Therapist:  06/06/2020 1:21 PM  Other:  06/06/2020 1:21 PM  Other:  06/06/2020 1:21 PM  Other: 06/06/2020 1:21 PM    Scribe for Treatment Team: Bethann Berkshire, LCSW 06/06/2020 1:36 PM

## 2020-06-06 NOTE — H&P (Addendum)
Psychiatric Admission Assessment Adult  Patient Identification: Ronald Brooks MRN:  [id number] Date of Evaluation:  06/06/2020 Chief Complaint:  " medications are no longer working " Principal Diagnosis:  Bipolar Disorder, consider Mixed. Alcohol and Cocaine Use Disorder in remission Diagnosis:  Bipolar Disorder, consider Mixed. Alcohol and Cocaine Use Disorder in remission History of Present Illness: 44 year old male.  Resides in [address] a residential sober setting. Presented on 7/29, reporting worsening mood.  Reported he felt his prescribed medications ( Depakote, Zyprexa) are no longer working.  He states he last took his medications 3 days prior. He endorsed depression, suicidal thoughts with recent thoughts of cutting wrists, neurovegetative symptoms to include anhedonia, decreased sleep and appetite.  He also reported irritability, anger, racing thoughts. He reports a history of alcohol and cocaine use disorder but states he has been sober for 140 days.  He does endorse increasing cravings over recent days. On presentation he appears irritable, angry, loud at times. He denies hallucinations and does not appear psychotic at this time. He reports stressful job and upcoming court date for a probation violation as contributing stressors. Associated Signs/Symptoms: Depression Symptoms:  depressed mood, anhedonia, suicidal thoughts with specific plan, decreased appetite, (Hypo) Manic Symptoms: Irritability, anger, racing thoughts Anxiety Symptoms: Increased anxiety Psychotic Symptoms: Does not endorse psychotic symptoms PTSD Symptoms: reports past history of trauma, currently does not elaborate or endorse PTSD symptoms  Total Time spent with patient: 45 minutes  Past Psychiatric History: Reports history of bipolar disorder. Chart notes indicate history of PTSD.   History of prior ED visits for mood/substance use related issues.  He was observed ( at [facility] ) in December  2020 at which time he had presented for suicidal ideations, alcohol/cocaine use, medication noncompliance.  At the time of discharge on Zyprexa and Depakote.  Is the patient at risk to self? Yes.    Has the patient been a risk to self in the past 6 months? Yes.    Has the patient been a risk to self within the distant past? No.  Is the patient a risk to others? Yes.    Has the patient been a risk to others in the past 6 months? No.  Has the patient been a risk to others within the distant past? No.   Prior Inpatient Therapy: Prior Inpatient Therapy: Yes Prior Therapy Dates: 10/2019 Prior Therapy Facilty/Provider(s): [facility] Reason for Treatment: depression Prior Outpatient Therapy: Prior Outpatient Therapy: Yes Prior Therapy Dates: active Prior Therapy Facilty/Provider(s): [facility] Reason for Treatment: med-management Does patient have an ACCT team?: No Does patient have Intensive In-House Services?  : No  Alcohol Screening:   Substance Abuse History in the last 12 months: History of alcohol and cocaine use disorder, currently in remission (140 days) Consequences of Substance Abuse:  Previous Psychotropic Medications: Home medications listed as Depakote 5 mg every morning and 5 mg nightly, Zyprexa 10 mg nightly.  Patient reports he feels the medications are no longer working, last took them a few days ago. Psychological Evaluations:  No Past Medical History: Reports history of asthma. Past Medical History:  Diagnosis Date  . ADHD   . Asthma   . Bipolar 1 disorder [facility])     Past Surgical History:  Procedure Laterality Date  . WISDOM TOOTH EXTRACTION     Family History: reports parents deceased . Family Psychiatric  History: reports history of bipolar disorder, substance use disorder in family, and reports an uncle committed suicide . 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 Types: Cocaine, "Crack" cocaine   Comment: has not used crack/cocaine for 140 days    Additional Social History: Marital status: Separated    Pain Medications: see MAR Prescriptions: see MAR Over the Counter: see MAR History of alcohol / drug use?: Yes Longest period of sobriety (when/how long): more than 140 days clean and sober Negative Consequences of Use: Financial, Armed forces operational officer, Personal relationships, Work / School Name of Substance 1: cocaine 1 - Age of First Use: not assessed 1 - Amount (size/oz): not assessed 1 - Frequency: has not used in140 plus days 1 - Duration: unknown 1 - Last Use / Amount: 140 days   Allergies:   Allergies  Allergen Reactions  . Other Shortness Of Breath    "dander"  . Vitamin E Rash   Lab Results:  Results for orders placed or performed during the hospital encounter of 06/05/20 (from the past 48 hour(s))  SARS Coronavirus 2 by RT PCR (hospital order, performed in [facility] hospital lab) Nasopharyngeal Nasopharyngeal Swab     Status: None   Collection Time: 06/05/20  5:00 PM   Specimen: Nasopharyngeal Swab  Result Value Ref Range   SARS Coronavirus 2 NEGATIVE NEGATIVE    Comment: (NOTE) SARS-CoV-2 target nucleic acids are NOT DETECTED.  The SARS-CoV-2 RNA is generally detectable in upper and lower respiratory specimens during the acute phase of infection. The lowest concentration of SARS-CoV-2 viral copies this assay can detect is 250 copies / mL. A negative result does not preclude SARS-CoV-2 infection and should not be used as the sole basis for treatment or other patient management decisions.  A negative result may occur with improper specimen collection / handling, submission of specimen other than nasopharyngeal swab, presence  of viral mutation(s) within the areas targeted by this assay, and inadequate number of viral copies (<250 copies / mL). A negative result must be combined with clinical observations, patient history, and epidemiological information.  Fact Sheet for Patients:   BoilerBrush.com.cy  Fact Sheet for Healthcare Providers: https://pope.com/  This test is not yet approved or  cleared by the [place] FDA and has been authorized for detection and/or diagnosis of SARS-CoV-2 by FDA under an Emergency Use Authorization (EUA).  This EUA will remain in effect (meaning this test can be used) for the duration of the COVID-19 declaration under Section 564(b)(1) of the Act, 21 U.S.C. section 360bbb-3(b)(1), unless the authorization is terminated or revoked sooner.  Performed at [facility], [address] [place], [place] [address]   Urinalysis, Routine w reflex microscopic     Status: Abnormal   Collection Time: 06/05/20 10:21 PM  Result Value Ref Range   Color, Urine YELLOW YELLOW   APPearance CLEAR CLEAR   Specific Gravity, Urine 1.019 1.005 - 1.030   pH 6.0 5.0 - 8.0   Glucose, UA NEGATIVE NEGATIVE mg/dL   Hgb urine dipstick NEGATIVE NEGATIVE   Bilirubin Urine NEGATIVE NEGATIVE   Ketones, ur 5 (A) NEGATIVE mg/dL   Protein, ur NEGATIVE NEGATIVE mg/dL   Nitrite NEGATIVE NEGATIVE   Leukocytes,Ua NEGATIVE NEGATIVE    Comment: Performed at [facility], [address] [place], [place] [address]  Urine rapid drug screen (hosp performed)not at [facility]     Status: None   Collection Time: 06/05/20 10:21 PM  Result Value Ref Range   Opiates NONE DETECTED NONE DETECTED   Cocaine NONE DETECTED NONE DETECTED   Benzodiazepines NONE DETECTED NONE DETECTED   Amphetamines NONE DETECTED NONE DETECTED   Tetrahydrocannabinol NONE DETECTED NONE DETECTED   Barbiturates NONE DETECTED NONE DETECTED    Comment:  (NOTE) DRUG SCREEN FOR MEDICAL PURPOSES ONLY.  IF CONFIRMATION IS NEEDED FOR ANY PURPOSE, NOTIFY LAB WITHIN 5 DAYS.  LOWEST DETECTABLE LIMITS FOR URINE DRUG SCREEN Drug Class                     Cutoff (ng/mL) Amphetamine and metabolites    1000 Barbiturate and metabolites    200 Benzodiazepine                 200 Tricyclics and metabolites     300 Opiates and metabolites        300 Cocaine and metabolites        300 THC                            50 Performed at [facility], [address] [place], [place] [address]   CBC     Status: None   Collection Time: 06/06/20  6:34 AM  Result Value Ref Range   WBC 7.3 4.0 - 10.5 K/uL   RBC 5.05 4.22 - 5.81 MIL/uL   Hemoglobin 15.2 13.0 - 17.0 g/dL   HCT 09.845.0 39 - 52 %   MCV 89.1 80.0 - 100.0 fL   MCH 30.1 26.0 - 34.0 pg   MCHC 33.8 30.0 - 36.0 g/dL   RDW 11.913.1 14.711.5 - 82.915.5 %   Platelets 214 150 - 400 K/uL   nRBC 0.0 0.0 - 0.2 %    Comment: Performed at [facility], [address] [place], [place] [address]  Comprehensive metabolic panel     Status: None   Collection Time: 06/06/20  6:34 AM  Result Value Ref Range   Sodium 136 135 - 145 mmol/L   Potassium 4.4 3.5 - 5.1 mmol/L   Chloride 102 98 - 111 mmol/L   CO2 26 22 - 32 mmol/L   Glucose, Bld 90 70 - 99 mg/dL    Comment: Glucose reference range applies only to samples taken after fasting for at least 8 hours.   BUN 15 6 - 20 mg/dL   Creatinine, Ser 0.860.87 0.61 - 1.24 mg/dL   Calcium 9.4 8.9 - 57.810.3 mg/dL   Total Protein 7.2 6.5 - 8.1 g/dL   Albumin 3.9 3.5 - 5.0 g/dL   AST 19 15 - 41 U/L   ALT 21 0 - 44 U/L   Alkaline Phosphatase 48 38 - 126 U/L   Total Bilirubin 0.7 0.3 - 1.2 mg/dL   GFR calc non Af Amer >60 >60 mL/min   GFR calc Af Amer >60 >60 mL/min   Anion gap 8 5 - 15    Comment: Performed at [facility], [address] [place], [place] [address]  Hemoglobin A1c     Status: None   Collection Time: 06/06/20   6:34 AM  Result Value Ref Range   Hgb A1c MFr Bld 5.4 4.8 - 5.6 %    Comment: (NOTE)  Pre diabetes:          5.7%-6.4%  Diabetes:              >6.4%  Glycemic control for   <7.0% adults with diabetes    Mean Plasma Glucose 108.28 mg/dL    Comment: Performed at [facility], [address] [place], [place] [address]  Ethanol     Status: None   Collection Time: 06/06/20  6:34 AM  Result Value Ref Range   Alcohol, Ethyl (B) <10 <10 mg/dL    Comment: (NOTE) Lowest detectable limit for serum alcohol is 10 mg/dL.  For medical purposes only. Performed at [facility], [address] [place], [place] [address]   Lipid panel     Status: Abnormal   Collection Time: 06/06/20  6:34 AM  Result Value Ref Range   Cholesterol 232 (H) 0 - 200 mg/dL   Triglycerides 341 (H) <150 mg/dL   HDL 41 >93 mg/dL   Total CHOL/HDL Ratio 5.7 RATIO   VLDL 39 0 - 40 mg/dL   LDL Cholesterol 790 (H) 0 - 99 mg/dL    Comment:        Total Cholesterol/HDL:CHD Risk Coronary Heart Disease Risk Table                     Men   Women  1/2 Average Risk   3.4   3.3  Average Risk       5.0   4.4  2 X Average Risk   9.6   7.1  3 X Average Risk  23.4   11.0        Use the calculated Patient Ratio above and the CHD Risk Table to determine the patient's CHD Risk.        ATP III CLASSIFICATION (LDL):  <100     mg/dL   Optimal  240-973  mg/dL   Near or Above                    Optimal  130-159  mg/dL   Borderline  532-992  mg/dL   High  >426     mg/dL   Very High Performed at [facility], [address], [place], [place] [address]   TSH     Status: None   Collection Time: 06/06/20  6:34 AM  Result Value Ref Range   TSH 4.302 0.350 - 4.500 uIU/mL    Comment: Performed by a 3rd Generation assay with a functional sensitivity of <=0.01 uIU/mL. Performed at [facility], [address] [place], [place] [address]   Valproic acid level     Status:  Abnormal   Collection Time: 06/06/20  6:34 AM  Result Value Ref Range   Valproic Acid Lvl 35 (L) 50.0 - 100.0 ug/mL    Comment: Performed at [facility], [address] [place], [place] [address]    Blood Alcohol level:  Lab Results  Component Value Date   [facility] <10 06/06/2020   ETH <10 09/28/2019    Metabolic Disorder Labs:  Lab Results  Component Value Date   HGBA1C 5.4 06/06/2020   MPG 108.28 06/06/2020   MPG 105.41 09/17/2019   No results found for: PROLACTIN Lab Results  Component Value Date   CHOL 232 (H) 06/06/2020   TRIG 194 (H) 06/06/2020   HDL 41 06/06/2020   CHOLHDL 5.7 06/06/2020   VLDL 39 06/06/2020   LDLCALC  152 (H) 06/06/2020    Current Medications: Current Facility-Administered Medications  Medication Dose Route Frequency Provider Last Rate Last Admin  . acetaminophen (TYLENOL) tablet 650 mg  650 mg Oral Q6H PRN Nwoko, Agnes I, NP      . alum & mag hydroxide-simeth (MAALOX/MYLANTA) 200-200-20 MG/5ML suspension 30 mL  30 mL Oral Q4H PRN Nwoko, Agnes I, NP      . hydrOXYzine (ATARAX/VISTARIL) tablet 25 mg  25 mg Oral TID PRN Armandina Stammer I, NP   25 mg at 06/06/20 1844  . magnesium hydroxide (MILK OF MAGNESIA) suspension 30 mL  30 mL Oral Daily PRN Nwoko, Agnes I, NP      . nicotine (NICODERM CQ - dosed in mg/24 hours) patch 21 mg  21 mg Transdermal Q0600 Armandina Stammer I, NP   21 mg at 06/06/20 0814  . nicotine polacrilex (NICORETTE) gum 2 mg  2 mg Oral PRN Anike, Adaku C, NP   2 mg at 06/05/20 2138  . traZODone (DESYREL) tablet 50 mg  50 mg Oral QHS PRN Armandina Stammer I, NP   50 mg at 06/05/20 2139   PTA Medications: Medications Prior to Admission  Medication Sig Dispense Refill Last Dose  . albuterol (VENTOLIN HFA) 108 (90 Base) MCG/ACT inhaler Inhale 1-2 puffs into the lungs every 6 (six) hours as needed for wheezing or shortness of breath. 6.7 g 0   . divalproex (DEPAKOTE) 500 MG DR tablet Take one tablet (500 mg) by mouth each morning  and two tablets (1000 mg) by mouth at bedtime. 90 tablet 0   . loratadine (CLARITIN) 10 MG tablet Take 10 mg by mouth daily.     Mar[place] Kitchen ZYPREXA 10 MG tablet Take 10 mg by mouth at bedtime.      . ramelteon (ROZEREM) 8 MG tablet Take 1 tablet (8 mg total) by mouth at bedtime. (Patient not taking: Reported on 06/06/2020) 30 tablet 0 Not Taking at Unknown time    Musculoskeletal: Strength & Muscle Tone: within normal limits no tremors , no diaphoresis, no restlessness or agitation  Gait & Station: normal Patient leans: N/A  Psychiatric Specialty Exam: Physical Exam  Review of Systems  Constitutional: Negative.   HENT: Negative.   Eyes: Negative.   Respiratory: Negative.  Negative for cough and shortness of breath.   Cardiovascular: Negative.  Negative for chest pain.  Gastrointestinal: Negative.  Negative for nausea and vomiting.  Endocrine: Negative.   Genitourinary: Negative.   Musculoskeletal: Negative.   Skin: Negative.   Allergic/Immunologic: Negative.   Neurological: Negative for seizures.  Psychiatric/Behavioral: Positive for behavioral problems and suicidal ideas.       Depression    Blood pressure 111/74, pulse 101, temperature 98.4 F (36.9 C), temperature source Oral, resp. rate 18, height  (1.803 m), weight (!) 97.5 kg, SpO2 97 %.Body mass index is 29.99 kg/m.  General Appearance: Fairly Groomed  Eye Contact:  Fair  Speech:  Normal Rate  Volume:  Intermittent complete out  Mood:  Dysphoric, irritable  Affect:  Congruent  Thought Process:  Linear and Descriptions of Associations: Intact  Orientation:  Full (Time, Place, and Person)  Thought Content:  No hallucinations, no delusions are expressed, does not appear internally preoccupied  Suicidal Thoughts:  No currently denies suicidal ideation and contracts for safety on unit  Homicidal Thoughts:  No on initial assessment had reported vague HI, not directed towards anybody in particular, today denies any homicidal or  violent ideations  Memory:  Recent and  remote grossly intact  Judgement:  Fair  Insight:  Fair  Psychomotor Activity:  Normal no psychomotor agitation  Concentration:  Concentration: Fair and Attention Span: Fair  Recall:  Good  Fund of Knowledge:  Good  Language:  Good  Akathisia:  Negative  Handed:  Right  AIMS (if indicated):     Assets:  Communication Skills Desire for Improvement Resilience  ADL's:  Intact  Cognition:  WNL  Sleep:  Number of Hours: 4.5    Treatment Plan Summary: Daily contact with patient to assess and evaluate symptoms and progress in treatment, Medication management, Plan Inpatient treatment and Medications as below  Observation Level/Precautions:  15 minute checks  Laboratory:  As needed-labs reviewed CMP unremarkable, lipid panel: Cholesterol 232, CBC unremarkable, no uric acid serum level 35, hemoglobin A1c 5.4, TSH 4.302. Admission BAL and UDS negative  Psychotherapy: Milieu/group therapy  Medications: We discussed options.  As noted he states he feels Zyprexa and Depakote, which he has been taking up to recently are no longer effective.  We have reviewed other options. He remembers having been on Seroquel in the past, and states " it helped me sleep, I did all right with it" He is currently agreeing to Seroquel and Tegretol trial.  Side effects have been reviewed, to include risk of blood dyscrasias and severe rash on Carbamazepine, and risk of motor/metabolic side effects and sedation on Seroquel Start Tegretol 200 mg twice daily. Start Seroquel 50 mg twice daily. Agitation protocol for acute agitation as needed  Consultations: As needed  Discharge Concerns:  -  Estimated LOS: 4 days   Other:     Physician Treatment Plan for Primary Diagnosis: Bipolar disorder, consider mixed Long Term Goal(s): Improvement in symptoms so as ready for discharge  Short Term Goals: Ability to identify changes in lifestyle to reduce recurrence of condition will  improve, Ability to verbalize feelings will improve, Ability to disclose and discuss suicidal ideas, Ability to demonstrate self-control will improve, Ability to identify and develop effective coping behaviors will improve, Ability to maintain clinical measurements within normal limits will improve and Compliance with prescribed medications will improve  Physician Treatment Plan for Secondary Diagnosis: Alcohol/cocaine use disorder in remission Long Term Goal(s): Improvement in symptoms so as ready for discharge  Short Term Goals: Ability to identify changes in lifestyle to reduce recurrence of condition will improve and Ability to identify triggers associated with substance abuse/mental health issues will improve  I certify that inpatient services furnished can reasonably be expected to improve the patient's condition.    Craige Cotta, MD 7/30/20217:01 PM
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
## 2020-06-06 NOTE — Progress Notes (Signed)
   06/06/20 0815  Vital Signs  Temp 98.4 F (36.9 C)  Temp Source Oral  Pulse Rate 101  BP 111/74  BP Location Right Arm  BP Method Automatic  Patient Position (if appropriate) Standing   D: Patient denies SI/HI/AVH. Patient denies Anxiety and depression. Patient stated that  "..slept like shit"  Patient took nicotene patch. A:  Patient took scheduled medicine.  Support and encouragement provided Routine safety checks conducted every 15 minutes. Patient  Informed to notify staff with any concerns.   R: Safety maintained.

## 2020-06-06 NOTE — Progress Notes (Signed)
   06/06/20 2324  Psych Admission Type (Psych Patients Only)  Admission Status Voluntary  Psychosocial Assessment  Patient Complaints Depression  Eye Contact Fair  Facial Expression Flat  Affect Appropriate to circumstance  Speech Logical/coherent  Interaction Assertive  Appearance/Hygiene Unremarkable  Behavior Characteristics Cooperative;Appropriate to situation  Mood Depressed;Irritable  Thought Process  Coherency WDL  Content WDL  Delusions WDL  Perception WDL  Hallucination None reported or observed  Judgment WDL  Confusion WDL  Danger to Self  Current suicidal ideation? Passive  Agreement Not to Harm Self Yes  Description of Agreement contracts  Danger to Others  Danger to Others None reported or observed  Patient has a flat affect with some irritability. Did let nurse get EKG and went to bed after. Continues to have passive SI but no active SI.
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
## 2020-06-07 LAB — PROLACTIN: Prolactin: 18.1 ng/mL — ABNORMAL HIGH (ref 4.0–15.2)

## 2020-06-07 MED ORDER — TRAZODONE HCL 50 MG PO TABS
50.0000 mg | ORAL_TABLET | Freq: Every evening | ORAL | Status: DC | PRN
  Administered 2020-06-07 – 2020-06-08 (×2): 50 mg via ORAL
  Filled 2020-06-07: qty 14
  Filled 2020-06-07 (×2): qty 1

## 2020-06-07 NOTE — Progress Notes (Signed)
   06/07/20 0754  Vital Signs  Pulse Rate 78  BP 100/74  BP Location Left Arm  BP Method Automatic  Patient Position (if appropriate) Standing  Oxygen Therapy  SpO2 (!) 86 %   D:  Patient admits SI 8/10. Patient denies HI/AVH. Patient rates anxiety and depression 10/10. Patient isolates in room. A:  Patient took scheduled medicine.  Support and encouragement provided Routine safety checks conducted every 15 minutes. Patient  Informed to notify staff with any concerns.   R:  Safety maintained.

## 2020-06-07 NOTE — Progress Notes (Addendum)
Pt. Did not attend wrap up group.

## 2020-06-07 NOTE — [facility] Group Notes (Signed)
Adult Psychoeducational Group Note  Date:  06/07/2020 Time:  12:39 PM  Group Topic/Focus:  Goals Group:   The focus of this group is to help patients establish daily goals to achieve during treatment and discuss how the patient can incorporate goal setting into their daily lives to aide in recovery.  Participation Level:  Did Not Attend   Ronald Brooks 06/07/2020, 12:39 PM

## 2020-06-07 NOTE — Progress Notes (Addendum)
[facility] MD Progress Note  06/07/2020 9:49 AM Ronald Brooks  MRN:  [id number] Subjective:  "Tired, Hard to sleep in here" Patient was seen this morning in his bed sleeping but awoken by this provider for daily rounding.  He is admitted for irritability,anger and suicidal thoughts.  This morning he remained irritable and angry and would not want to answer questions.  He is angry that he is not getting sleeps here due to people asking him "too many stupid questions".  He reports that he cannot participate in group activities because he does not like crowds of people around him.  He denies feeling suicidal and at the same time states he has only taken medications here last night.  He denies psychotic symptoms.  He barely made eye contact with provider and refused to participate in the assessment.  Nursing note last night  states he did not attend wrap up group as well. Principal Problem: <principal problem not specified> Diagnosis: Active Problems:   Bipolar affective disorder, currently active (HCC)  Total Time spent with patient: 20 minutes  Past Psychiatric History: Significant for Bipolar d/o, PTSD as reported substance abuse.  Previous [facility] unit observation December 2020 for suicide ideation , alcohol and Cocaine use and medication non compliance.  Past Medical History:  Past Medical History:  Diagnosis Date  . ADHD   . Asthma   . Bipolar 1 disorder [facility])     Past Surgical History:  Procedure Laterality Date  . WISDOM TOOTH EXTRACTION     Family History: History reviewed. No pertinent family history. Family Psychiatric  History: Hx significant for Bipolar disorder ,Substance abuse per previous record Social History:  Social History   Substance and Sexual Activity  Alcohol Use Not Currently   Comment: 140 days sober     Social History   Substance and Sexual Activity  Drug Use Not Currently  . Types: Cocaine, "Crack" cocaine   Comment: has not used crack/cocaine for 140 days     Social History   Socioeconomic History  . Marital status: Legally Separated    Spouse name: Not on file  . Number of children: Not on file  . Years of education: Not on file  . Highest education level: Not on file  Occupational History  . Not on file  Tobacco Use  . Smoking status: Current Every Day Smoker    Packs/day: 1.50    Types: Cigarettes  . Smokeless tobacco: Never Used  Vaping Use  . Vaping Use: Never used  Substance and Sexual Activity  . Alcohol use: Not Currently    Comment: 140 days sober  . Drug use: Not Currently    Types: Cocaine, "Crack" cocaine    Comment: has not used crack/cocaine for 140 days  . Sexual activity: Not Currently  Other Topics Concern  . Not on file  Social History Narrative  . Not on file   Social Determinants of Health   Financial Resource Strain:   . Difficulty of Paying Living Expenses:   Food Insecurity:   . Worried About Programme researcher, broadcasting/film/video in the Last Year:   . Barista in the Last Year:   Transportation Needs:   . Freight forwarder (Medical):   Marland Kitchen Lack of Transportation (Non-Medical):   Physical Activity:   . Days of Exercise per Week:   . Minutes of Exercise per Session:   Stress:   . Feeling of Stress :   Social Connections:   . Frequency of Communication  with Friends and Family:   . Frequency of Social Gatherings with Friends and Family:   . Attends Religious Services:   . Active Member of Clubs or Organizations:   . Attends BankerClub or Organization Meetings:   Marland Kitchen. Marital Status:    Additional Social History:    Pain Medications: see MAR Prescriptions: see MAR Over the Counter: see MAR History of alcohol / drug use?: Yes Longest period of sobriety (when/how long): more than 140 days clean and sober Negative Consequences of Use: Financial, Legal, Personal relationships, Work / School Name of Substance 1: cocaine 1 - Age of First Use: not assessed 1 - Amount (size/oz): not assessed 1 - Frequency: has not  used in140 plus days 1 - Duration: unknown 1 - Last Use / Amount: 140 days                  Sleep: Good  Appetite:  Good  Current Medications: Current Facility-Administered Medications  Medication Dose Route Frequency Provider Last Rate Last Admin  . acetaminophen (TYLENOL) tablet 650 mg  650 mg Oral Q6H PRN Armandina StammerNwoko, Agnes I, NP      . carbamazepine (TEGRETOL) tablet 200 mg  200 mg Oral BID Cobos, Rockey SituFernando A, MD   200 mg at 06/07/20 0814  . hydrOXYzine (ATARAX/VISTARIL) tablet 25 mg  25 mg Oral TID PRN Armandina StammerNwoko, Agnes I, NP   25 mg at 06/06/20 1844  . OLANZapine zydis (ZYPREXA) disintegrating tablet 10 mg  10 mg Oral Q8H PRN Cobos, Rockey SituFernando A, MD       And  . LORazepam (ATIVAN) tablet 1 mg  1 mg Oral PRN Cobos, Rockey SituFernando A, MD       And  . ziprasidone (GEODON) injection 20 mg  20 mg Intramuscular PRN Cobos, Fernando A, MD      . nicotine (NICODERM CQ - dosed in mg/24 hours) patch 21 mg  21 mg Transdermal Q0600 Armandina StammerNwoko, Agnes I, NP   21 mg at 06/07/20 0815  . QUEtiapine (SEROQUEL) tablet 50 mg  50 mg Oral BID Cobos, Rockey SituFernando A, MD   50 mg at 06/07/20 0814  . traZODone (DESYREL) tablet 50 mg  50 mg Oral QHS PRN Earney Navynuoha, Laddie Naeem C, NP        Lab Results:  Results for orders placed or performed during the hospital encounter of 06/05/20 (from the past 48 hour(s))  SARS Coronavirus 2 by RT PCR (hospital order, performed in [facility] hospital lab) Nasopharyngeal Nasopharyngeal Swab     Status: None   Collection Time: 06/05/20  5:00 PM   Specimen: Nasopharyngeal Swab  Result Value Ref Range   SARS Coronavirus 2 NEGATIVE NEGATIVE    Comment: (NOTE) SARS-CoV-2 target nucleic acids are NOT DETECTED.  The SARS-CoV-2 RNA is generally detectable in upper and lower respiratory specimens during the acute phase of infection. The lowest concentration of SARS-CoV-2 viral copies this assay can detect is 250 copies / mL. A negative result does not preclude SARS-CoV-2 infection and should not be  used as the sole basis for treatment or other patient management decisions.  A negative result may occur with improper specimen collection / handling, submission of specimen other than nasopharyngeal swab, presence of viral mutation(s) within the areas targeted by this assay, and inadequate number of viral copies (<250 copies / mL). A negative result must be combined with clinical observations, patient history, and epidemiological information.  Fact Sheet for Patients:   BoilerBrush.com.cyhttps://www.fda.gov/media/136312/download  Fact Sheet for Healthcare Providers: https://pope.com/https://www.fda.gov/media/136313/download  This  test is not yet approved or  cleared by the [place] and has been authorized for detection and/or diagnosis of SARS-CoV-2 by FDA under an Emergency Use Authorization (EUA).  This EUA will remain in effect (meaning this test can be used) for the duration of the COVID-19 declaration under Section 564(b)(1) of the Act, 21 U.S.C. section 360bbb-3(b)(1), unless the authorization is terminated or revoked sooner.  Performed at [facility], [address], [place], [place] [address]   Urinalysis, Routine w reflex microscopic     Status: Abnormal   Collection Time: 06/05/20 10:21 PM  Result Value Ref Range   Color, Urine YELLOW YELLOW   APPearance CLEAR CLEAR   Specific Gravity, Urine 1.019 1.005 - 1.030   pH 6.0 5.0 - 8.0   Glucose, UA NEGATIVE NEGATIVE mg/dL   Hgb urine dipstick NEGATIVE NEGATIVE   Bilirubin Urine NEGATIVE NEGATIVE   Ketones, ur 5 (A) NEGATIVE mg/dL   Protein, ur NEGATIVE NEGATIVE mg/dL   Nitrite NEGATIVE NEGATIVE   Leukocytes,Ua NEGATIVE NEGATIVE    Comment: Performed at [facility], [address], [place], [place] [address]  Urine rapid drug screen (hosp performed)not at [facility]     Status: None   Collection Time: 06/05/20 10:21 PM  Result Value Ref Range   Opiates NONE DETECTED NONE DETECTED   Cocaine NONE DETECTED NONE  DETECTED   Benzodiazepines NONE DETECTED NONE DETECTED   Amphetamines NONE DETECTED NONE DETECTED   Tetrahydrocannabinol NONE DETECTED NONE DETECTED   Barbiturates NONE DETECTED NONE DETECTED    Comment: (NOTE) DRUG SCREEN FOR MEDICAL PURPOSES ONLY.  IF CONFIRMATION IS NEEDED FOR ANY PURPOSE, NOTIFY LAB WITHIN 5 DAYS.  LOWEST DETECTABLE LIMITS FOR URINE DRUG SCREEN Drug Class                     Cutoff (ng/mL) Amphetamine and metabolites    1000 Barbiturate and metabolites    200 Benzodiazepine                 200 Tricyclics and metabolites     300 Opiates and metabolites        300 Cocaine and metabolites        300 THC                            50 Performed at [facility], [address], [place], [place] [address]   CBC     Status: None   Collection Time: 06/06/20  6:34 AM  Result Value Ref Range   WBC 7.3 4.0 - 10.5 K/uL   RBC 5.05 4.22 - 5.81 MIL/uL   Hemoglobin 15.2 13.0 - 17.0 g/dL   HCT 17.4 39 - 52 %   MCV 89.1 80.0 - 100.0 fL   MCH 30.1 26.0 - 34.0 pg   MCHC 33.8 30.0 - 36.0 g/dL   RDW 08.1 44.8 - 18.5 %   Platelets 214 150 - 400 K/uL   nRBC 0.0 0.0 - 0.2 %    Comment: Performed at [facility], [address], [place], [place] [address]  Comprehensive metabolic panel     Status: None   Collection Time: 06/06/20  6:34 AM  Result Value Ref Range   Sodium 136 135 - 145 mmol/L   Potassium 4.4 3.5 - 5.1 mmol/L   Chloride 102 98 - 111 mmol/L   CO2 26 22 - 32  mmol/L   Glucose, Bld 90 70 - 99 mg/dL    Comment: Glucose reference range applies only to samples taken after fasting for at least 8 hours.   BUN 15 6 - 20 mg/dL   Creatinine, Ser 1.61 0.61 - 1.24 mg/dL   Calcium 9.4 8.9 - 09.6 mg/dL   Total Protein 7.2 6.5 - 8.1 g/dL   Albumin 3.9 3.5 - 5.0 g/dL   AST 19 15 - 41 U/L   ALT 21 0 - 44 U/L   Alkaline Phosphatase 48 38 - 126 U/L   Total Bilirubin 0.7 0.3 - 1.2 mg/dL   GFR calc non Af Amer >60 >60 mL/min   GFR calc  Af Amer >60 >60 mL/min   Anion gap 8 5 - 15    Comment: Performed at [facility], [address] [place], [place] [address]  Hemoglobin A1c     Status: None   Collection Time: 06/06/20  6:34 AM  Result Value Ref Range   Hgb A1c MFr Bld 5.4 4.8 - 5.6 %    Comment: (NOTE) Pre diabetes:          5.7%-6.4%  Diabetes:              >6.4%  Glycemic control for   <7.0% adults with diabetes    Mean Plasma Glucose 108.28 mg/dL    Comment: Performed at [facility], [address] [place], [place] [address]  Ethanol     Status: None   Collection Time: 06/06/20  6:34 AM  Result Value Ref Range   Alcohol, Ethyl (B) <10 <10 mg/dL    Comment: (NOTE) Lowest detectable limit for serum alcohol is 10 mg/dL.  For medical purposes only. Performed at [facility], [address] [place], [place] [address]   Lipid panel     Status: Abnormal   Collection Time: 06/06/20  6:34 AM  Result Value Ref Range   Cholesterol 232 (H) 0 - 200 mg/dL   Triglycerides 956 (H) <150 mg/dL   HDL 41 >21 mg/dL   Total CHOL/HDL Ratio 5.7 RATIO   VLDL 39 0 - 40 mg/dL   LDL Cholesterol 308 (H) 0 - 99 mg/dL    Comment:        Total Cholesterol/HDL:CHD Risk Coronary Heart Disease Risk Table                     Men   Women  1/2 Average Risk   3.4   3.3  Average Risk       5.0   4.4  2 X Average Risk   9.6   7.1  3 X Average Risk  23.4   11.0        Use the calculated Patient Ratio above and the CHD Risk Table to determine the patient's CHD Risk.        ATP III CLASSIFICATION (LDL):  <100     mg/dL   Optimal  657-846  mg/dL   Near or Above                    Optimal  130-159  mg/dL   Borderline  962-952  mg/dL   High  >841     mg/dL   Very High Performed at [facility], [address] [place], [place] [address]   TSH     Status: None   Collection Time: 06/06/20  6:34 AM  Result  Value Ref Range   TSH 4.302 0.350 - 4.500 uIU/mL    Comment:  Performed by a 3rd Generation assay with a functional sensitivity of <=0.01 uIU/mL. Performed at [facility], [address] [place], [place] [address]   Prolactin     Status: Abnormal   Collection Time: 06/06/20  6:34 AM  Result Value Ref Range   Prolactin 18.1 (H) 4.0 - 15.2 ng/mL    Comment: (NOTE) Performed At: [facility] [address] [place], [place] [id number] Jolene Schimke MD OZ:[id number]   Valproic acid level     Status: Abnormal   Collection Time: 06/06/20  6:34 AM  Result Value Ref Range   Valproic Acid Lvl 35 (L) 50.0 - 100.0 ug/mL    Comment: Performed at [facility], [address], [place], [place] [address]    Blood Alcohol level:  Lab Results  Component Value Date   [facility] <10 06/06/2020   ETH <10 09/28/2019    Metabolic Disorder Labs: Lab Results  Component Value Date   HGBA1C 5.4 06/06/2020   MPG 108.28 06/06/2020   MPG 105.41 09/17/2019   Lab Results  Component Value Date   PROLACTIN 18.1 (H) 06/06/2020   Lab Results  Component Value Date   CHOL 232 (H) 06/06/2020   TRIG 194 (H) 06/06/2020   HDL 41 06/06/2020   CHOLHDL 5.7 06/06/2020   VLDL 39 06/06/2020   LDLCALC 152 (H) 06/06/2020    Physical Findings: AIMS:  , ,  ,  ,    CIWA:  CIWA-Ar Total: 3 COWS:  COWS Total Score: 4  Musculoskeletal: Strength & Muscle Tone: within normal limits Gait & Station: normal Patient leans: N/A  Psychiatric Specialty Exam: Physical Exam Vitals and nursing note reviewed.  Constitutional:      Appearance: Normal appearance.  Neurological:     Mental Status: He is alert.     Review of Systems  Constitutional: Negative.   HENT: Negative.   Eyes: Negative.   Respiratory: Negative.   Gastrointestinal: Negative.   Endocrine: Negative.   Genitourinary: Negative.   Musculoskeletal: Negative.   Skin: Negative.   Allergic/Immunologic: Negative.   Neurological: Negative.   Hematological: Negative.    Psychiatric/Behavioral: Positive for agitation, behavioral problems and sleep disturbance.    Blood pressure 100/74, pulse 78, temperature 97.7 F (36.5 C), temperature source Oral, resp. rate 18, height  (1.803 m), weight (!) 97.5 kg, SpO2 (!) 86 %.Body mass index is 29.99 kg/m.  General Appearance: Fairly Groomed  Eye Contact:  Fair  Speech:  Normal Rate  Volume:  Intermittent complete out  Mood:  Dysphoric, irritable, angry  Affect:  Congruent  Thought Process:  Linear and Descriptions of Associations: Intact  Orientation:  Full (Time, Place, and Person)  Thought Content:  No hallucinations, no delusions are expressed, does not appear internally preoccupied  Suicidal Thoughts:  No currently denies suicidal ideation and contracts for safety on unit  Homicidal Thoughts:  No on initial assessment had reported vague HI, not directed towards anybody in particular, today denies any homicidal or violent ideations  Memory:  Recent and remote grossly intact  Judgement:  Fair  Insight:  Fair  Psychomotor Activity:  Normal no psychomotor agitation  Concentration:  Concentration: Fair and Attention Span: Fair  Recall:  Good  Fund of Knowledge:  Good  Language:  Good  Akathisia:  Negative  Handed:  Right  AIMS (if indicated):     Assets:  Communication Skills Desire  for Improvement Resilience  ADL's:  Intact  Cognition:  WNL    Sleep:  Number of Hours: 6.75   Treatment Plan Summary: Daily contact with patient to assess and evaluate symptoms and progress in treatment and Medication management.  - Continue inpatient hospitalization. - Treatment plan reviewed and new medication ordered.  1. Mood Control    Continue Seroquel 50 mg po bid  2. Mood stabilization     Continue Tegretol 200 mg bid  3.  Insomnia      Initiate Trazodone 50 mg po at bed time. - Encourage group participation. - Discharge disposition plan in progress.  Earney Navy, NP 06/07/2020, 9:49 AM

## 2020-06-07 NOTE — Progress Notes (Signed)
Per [facility] - they will come pick this patient up as soon as he is discharged.

## 2020-06-08 NOTE — [facility] Group Notes (Signed)
Adult Psychoeducational Group Note  Date:  06/08/2020 Time:  12:54 PM  Group Topic/Focus:   PROGRESSIVE RELAXATION. A group where deep breathing is taught and tensing and relaxation muscle groups is used. Imagery is used as well.  Pts are asked to imagine 3 pillars that hold them up when they are not able to hold themselves up.    Participation Level:  Did Not Attend   Dione Housekeeper 06/08/2020, 12:54 PM

## 2020-06-08 NOTE — Progress Notes (Signed)
   06/08/20 2345  Psych Admission Type (Psych Patients Only)  Admission Status Voluntary  Psychosocial Assessment  Patient Complaints Depression  Eye Contact Fair  Facial Expression Flat  Affect Appropriate to circumstance  Speech Logical/coherent  Interaction Arrogant;Demanding;Guarded  Motor Activity Restless  Appearance/Hygiene Unremarkable  Behavior Characteristics Anxious  Mood Depressed;Anxious  Thought Process  Coherency WDL  Content WDL  Delusions WDL  Perception WDL  Hallucination None reported or observed  Judgment WDL  Confusion WDL  Danger to Self  Current suicidal ideation? Denies  Self-Injurious Behavior No self-injurious ideation or behavior indicators observed or expressed   Agreement Not to Harm Self Yes  Description of Agreement Verbal  Danger to Others  Danger to Others None reported or observed  D: Patient in dayroom reports he had a good day. Pt is guarded and affect flat. A: Medications administered as prescribed. Support and encouragement provided as needed.  R: Patient remains safe on the unit. Will continue to monitor for safety and stability.

## 2020-06-08 NOTE — Progress Notes (Signed)
Adult Psychoeducational Group Note  Date:  06/08/2020 Time:  9:17 PM  Group Topic/Focus:  Wrap-Up Group:   The focus of this group is to help patients review their daily goal of treatment and discuss progress on daily workbooks.  Participation Level:  Active  Participation Quality:  Appropriate  Affect:  Appropriate  Cognitive:  Appropriate  Insight: Appropriate  Engagement in Group:  Engaged  Modes of Intervention:  Discussion  Additional Comments:  Patient attended group and participated.   Deano Tomaszewski W Areg Bialas 06/08/2020, 9:17 PM

## 2020-06-08 NOTE — Plan of Care (Signed)
Patient was visible in the milieu but seemed to be worried about something. Restless and frequently coming to the medication window but avoiding to discuss his situation. Received Vistaril for anxiety and restlessness. Patient returned to the medication window complaining of back pain and received Tylenol. Currently in bed resting. Safety precautions reinforced.

## 2020-06-08 NOTE — [facility] Group Notes (Signed)
Date:  06/08/2020 Time:  12:57 PM  Group Topic/Focus:  Making Healthy Choices:   The focus of this group is to help patients identify negative/unhealthy choices they were using prior to admission and identify positive/healthier coping strategies to replace them upon discharge.  Participation Level:  Did Not Attend   Ronald Brooks A 06/08/2020, 12:57 PM  

## 2020-06-08 NOTE — Progress Notes (Signed)
[facility] MD Progress Note  06/08/2020 2:48 PM Ronald Brooks  MRN:  [id number] Subjective:  " I feel much better today, I got a shower" Patient was seen awake in his room but walked out with provider for daily evaluation.  He reports feeling better and that he voluntarily showered.  This is a positive change from yesterday when patient was irritable, angry and did not want to participate in assessment.  He reports better sleep but poor appetite.  [facility] patient up om discharge.  He denies suicide/Homicidal ideation and no mention of Psychotic symptoms.  So far he did not participate in any group activity but has been encouraged to do so. Principal Problem: <principal problem not specified> Diagnosis: Active Problems:   Bipolar affective disorder, currently active (HCC)  Total Time spent with patient: 20 minutes  Past Psychiatric History: Significant for Bipolar d/o, PTSD as reported substance abuse.  Previous [facility] unit observation December 2020 for suicide ideation , alcohol and Cocaine use and medication non compliance.  Past Medical History:  Past Medical History:  Diagnosis Date  . ADHD   . Asthma   . Bipolar 1 disorder [facility])     Past Surgical History:  Procedure Laterality Date  . WISDOM TOOTH EXTRACTION     Family History: History reviewed. No pertinent family history. Family Psychiatric  History: Hx significant for Bipolar disorder ,Substance abuse per previous record Social History:  Social History   Substance and Sexual Activity  Alcohol Use Not Currently   Comment: 140 days sober     Social History   Substance and Sexual Activity  Drug Use Not Currently  . Types: Cocaine, "Crack" cocaine   Comment: has not used crack/cocaine for 140 days    Social History   Socioeconomic History  . Marital status: Legally Separated    Spouse name: Not on file  . Number of children: Not on file  . Years of education: Not on file  . Highest  education level: Not on file  Occupational History  . Not on file  Tobacco Use  . Smoking status: Current Every Day Smoker    Packs/day: 1.50    Types: Cigarettes  . Smokeless tobacco: Never Used  Vaping Use  . Vaping Use: Never used  Substance and Sexual Activity  . Alcohol use: Not Currently    Comment: 140 days sober  . Drug use: Not Currently    Types: Cocaine, "Crack" cocaine    Comment: has not used crack/cocaine for 140 days  . Sexual activity: Not Currently  Other Topics Concern  . Not on file  Social History Narrative  . Not on file   Social Determinants of Health   Financial Resource Strain:   . Difficulty of Paying Living Expenses:   Food Insecurity:   . Worried About Programme researcher, broadcasting/film/video in the Last Year:   . Barista in the Last Year:   Transportation Needs:   . Freight forwarder (Medical):   Marland Kitchen Lack of Transportation (Non-Medical):   Physical Activity:   . Days of Exercise per Week:   . Minutes of Exercise per Session:   Stress:   . Feeling of Stress :   Social Connections:   . Frequency of Communication with Friends and Family:   . Frequency of Social Gatherings with Friends and Family:   . Attends Religious Services:   . Active Member of Clubs or Organizations:   . Attends Banker  Meetings:   Marland Kitchen Marital Status:    Additional Social History:    Pain Medications: see MAR Prescriptions: see MAR Over the Counter: see MAR History of alcohol / drug use?: Yes Longest period of sobriety (when/how long): more than 140 days clean and sober Negative Consequences of Use: Financial, Legal, Personal relationships, Work / School Name of Substance 1: cocaine 1 - Age of First Use: not assessed 1 - Amount (size/oz): not assessed 1 - Frequency: has not used in140 plus days 1 - Duration: unknown 1 - Last Use / Amount: 140 days                  Sleep: Good  Appetite:  Good  Current Medications: Current Facility-Administered  Medications  Medication Dose Route Frequency Provider Last Rate Last Admin  . acetaminophen (TYLENOL) tablet 650 mg  650 mg Oral Q6H PRN Armandina Stammer I, NP   650 mg at 06/08/20 0647  . carbamazepine (TEGRETOL) tablet 200 mg  200 mg Oral BID Cobos, Rockey Situ, MD   200 mg at 06/08/20 0981  . hydrOXYzine (ATARAX/VISTARIL) tablet 25 mg  25 mg Oral TID PRN Armandina Stammer I, NP   25 mg at 06/07/20 2103  . OLANZapine zydis (ZYPREXA) disintegrating tablet 10 mg  10 mg Oral Q8H PRN Cobos, Rockey Situ, MD       And  . LORazepam (ATIVAN) tablet 1 mg  1 mg Oral PRN Cobos, Rockey Situ, MD       And  . ziprasidone (GEODON) injection 20 mg  20 mg Intramuscular PRN Cobos, Fernando A, MD      . nicotine (NICODERM CQ - dosed in mg/24 hours) patch 21 mg  21 mg Transdermal Q0600 Armandina Stammer I, NP   21 mg at 06/08/20 0800  . QUEtiapine (SEROQUEL) tablet 50 mg  50 mg Oral BID Cobos, Rockey Situ, MD   50 mg at 06/08/20 1914  . traZODone (DESYREL) tablet 50 mg  50 mg Oral QHS PRN Dahlia Byes C, NP   50 mg at 06/07/20 2103    Lab Results:  No results found for this or any previous visit (from the past 48 hour(s)).  Blood Alcohol level:  Lab Results  Component Value Date   ETH <10 06/06/2020   ETH <10 09/28/2019    Metabolic Disorder Labs: Lab Results  Component Value Date   HGBA1C 5.4 06/06/2020   MPG 108.28 06/06/2020   MPG 105.41 09/17/2019   Lab Results  Component Value Date   PROLACTIN 18.1 (H) 06/06/2020   Lab Results  Component Value Date   CHOL 232 (H) 06/06/2020   TRIG 194 (H) 06/06/2020   HDL 41 06/06/2020   CHOLHDL 5.7 06/06/2020   VLDL 39 06/06/2020   LDLCALC 152 (H) 06/06/2020    Physical Findings: AIMS:  , ,  ,  ,    CIWA:  CIWA-Ar Total: 3 COWS:  COWS Total Score: 4  Musculoskeletal: Strength & Muscle Tone: within normal limits Gait & Station: normal Patient leans: N/A  Psychiatric Specialty Exam: Physical Exam Vitals and nursing note reviewed.  Constitutional:       Appearance: Normal appearance.  Neurological:     Mental Status: He is alert and oriented to person, place, and time.     Review of Systems  Constitutional: Negative.   HENT: Negative.   Eyes: Negative.   Respiratory: Negative.   Gastrointestinal: Negative.   Endocrine: Negative.   Genitourinary: Negative.   Musculoskeletal: Negative.  Skin: Negative.   Allergic/Immunologic: Negative.   Neurological: Negative.   Hematological: Negative.     Blood pressure 101/78, pulse 101, temperature 98.1 F (36.7 C), temperature source Oral, resp. rate 18, height 5\' 11"  (1.803 m), weight (!) 97.5 kg, SpO2 97 %.Body mass index is 29.99 kg/m.  General Appearance: Fairly Groomed  Eye Contact:  Fair  Speech:  Normal Rate  Volume:  normal  Mood: " okay"  Affect:  less irritable,   Thought Process:  Linear and Descriptions of Associations: Intact  Orientation:  Full (Time, Place, and Person)  Thought Content:  No hallucinations, no delusions are expressed, does not appear internally preoccupied  Suicidal Thoughts:  Denies  Homicidal Thoughts:  denies  Memory:  Recent and remote grossly intact  Judgement:  Fair  Insight:  Fair  Psychomotor Activity:  Normal no psychomotor agitation  Concentration:  Concentration: Fair and Attention Span: Fair  Recall:  Good  Fund of Knowledge:  Good  Language:  Good  Akathisia:  Negative  Handed:  Right  AIMS (if indicated):     Assets:  Communication Skills Desire for Improvement Resilience  ADL's:  Intact  Cognition:  WNL    Sleep:  Number of Hours: 6.75   Treatment Plan Summary: Daily contact with patient to assess and evaluate symptoms and progress in treatment and Medication management.  - Continue inpatient hospitalization. - Treatment plan reviewed and new medication ordered.  1. Mood Control    Continue Seroquel 50 mg po bid  2. Mood stabilization     Continue Tegretol 200 mg bid  3.  Insomnia     Continue Trazodone 50 mg po at  bed time. - Encourage group participation. - Discharge disposition plan in progress.  , NP 06/08/2020, 2:48 PM

## 2020-06-08 NOTE — Progress Notes (Signed)
D:  Patient denied SI and HI, contracts for safety.  Denied A/V hallucinations.   A:  Medications administered per MD orders.  Emotional support and encouragement given patient. R:  Safety maintained with 15 minute checks.  

## 2020-06-08 NOTE — Plan of Care (Signed)
Nurse discussed anxiety, depression and coping skills with patient.  

## 2020-06-08 NOTE — Progress Notes (Addendum)
Patient's self inventory sheet, patient sleeps good, no sleep medication.  Fair appetite, low energy level, good concentration.  Rated depression 7.5, hopeless 7 and anxiety 7.  Denied withdrawals.  SI, contract for safety.  Denied physical problems.  Physical pain, rated 8 for lower back.  Pain medicine is helpful.  Goal is getting use to his meds.  No discharge plans.  Patient had denied SI earlier in the morning, contracts for safety.

## 2020-06-09 MED ORDER — HYDROXYZINE HCL 25 MG PO TABS: 25.0000 mg | ORAL_TABLET | Freq: Three times a day (TID) | ORAL | 0 refills | Status: AC | PRN

## 2020-06-09 MED ORDER — CARBAMAZEPINE 200 MG PO TABS: 200.0000 mg | ORAL_TABLET | Freq: Two times a day (BID) | ORAL | 0 refills | Status: AC

## 2020-06-09 MED ORDER — QUETIAPINE FUMARATE 50 MG PO TABS: 50.0000 mg | ORAL_TABLET | Freq: Two times a day (BID) | ORAL | 0 refills | Status: AC

## 2020-06-09 MED ORDER — NICOTINE 21 MG/24HR TD PT24: 21.0000 mg | MEDICATED_PATCH | Freq: Every day | TRANSDERMAL | 0 refills | Status: AC

## 2020-06-09 MED ORDER — TRAZODONE HCL 50 MG PO TABS: 50.0000 mg | ORAL_TABLET | Freq: Every evening | ORAL | 0 refills | Status: AC | PRN

## 2020-06-09 NOTE — Progress Notes (Signed)
Discharge Note:  [facility]  Patient denied SI and HI.  Denied A/V hallucinations.  Suicide prevention information given and discussed with patient who stated he understood and had no questions.  Patient stated he received all his belongings, toiletries, misc items, etc.  Patient stated he appreciated all assistance received from [facility] staff.  All required discharge information given to patient at discharge.

## 2020-06-09 NOTE — Progress Notes (Signed)
  [facility] Adult Case Management Discharge Plan :  Will you be returning to the same living situation after discharge:  Yes,  [facility] At discharge, do you have transportation home?: Yes,  [facility] Do you have the ability to pay for your medications: No.  Release of information consent forms completed and in the chart;  Patient's signature needed at discharge.  Patient to Follow up at:  Follow-up Information    [facility] Follow up.   Why: Please contact this provider if you are interested in residential services.  Contact information: [address] [place], [place] [address]   Phone: [phone] Fax: [phone]              Next level of care provider has access to [facility]:no  Safety Planning and Suicide Prevention discussed: Yes,  yes     Has patient been referred to the Quitline?: Patient refused referral  Patient has been referred for addiction treatment: Yes [facility] and is returning.   Erin Sons, LCSW 06/09/2020, 10:27 AM

## 2020-06-09 NOTE — Progress Notes (Signed)
D:  Patient refused to fill out self inventory sheet this morning.  Denied SI and HI, contracts for safety.  Denied A/V hallucinations. A:  Medications administered per MD orders.  Emotional support and encouragement given patient. R:  Safety maintained with 15 minute checks.

## 2020-06-09 NOTE — Discharge Summary (Signed)
Physician Discharge Summary Note  Patient:  Ronald Brooks is an 44 y.o., male MRN:  [id number] DOB:  03-31-1976 Patient phone:  [phone] (home)  Patient address:   [address] [place] [address],  Total Time spent with patient: 15 minutes  Date of Admission:  06/05/2020 Date of Discharge: 06/09/20  Reason for Admission:  Suicidal ideation  Principal Problem: Bipolar affective disorder, currently active [facility]) Discharge Diagnoses: Principal Problem:   Bipolar affective disorder, currently active [facility])   Past Psychiatric History: Reports history of bipolar disorder. Chart notes indicate history of PTSD.   History of prior ED visits for mood/substance use related issues.  He was observed ( at [facility]  OBS Unit ) in December 2020 at which time he had presented for suicidal ideations, alcohol/cocaine use, medication noncompliance.  At the time of discharge on Zyprexa and Depakote.  Past Medical History:  Past Medical History:  Diagnosis Date  . ADHD   . Asthma   . Bipolar 1 disorder [facility])     Past Surgical History:  Procedure Laterality Date  . WISDOM TOOTH EXTRACTION     Family History: History reviewed. No pertinent family history. Family Psychiatric  History: reports history of bipolar disorder, substance use disorder in family, and reports an uncle committed suicide . Social History:  Social History   Substance and Sexual Activity  Alcohol Use Not Currently   Comment: 140 days sober     Social History   Substance and Sexual Activity  Drug Use Not Currently  . Types: Cocaine, "Crack" cocaine   Comment: has not used crack/cocaine for 140 days    Social History   Socioeconomic History  . Marital status: Legally Separated    Spouse name: Not on file  . Number of children: Not on file  . Years of education: Not on file  . Highest education level: Not on file  Occupational History  . Not on file  Tobacco Use  . Smoking status: Current Every Day Smoker     Packs/day: 1.50    Types: Cigarettes  . Smokeless tobacco: Never Used  Vaping Use  . Vaping Use: Never used  Substance and Sexual Activity  . Alcohol use: Not Currently    Comment: 140 days sober  . Drug use: Not Currently    Types: Cocaine, "Crack" cocaine    Comment: has not used crack/cocaine for 140 days  . Sexual activity: Not Currently  Other Topics Concern  . Not on file  Social History Narrative  . Not on file   Social Determinants of Health   Financial Resource Strain:   . Difficulty of Paying Living Expenses:   Food Insecurity:   . Worried About Programme researcher, broadcasting/film/video in the Last Year:   . Barista in the Last Year:   Transportation Needs:   . Freight forwarder (Medical):   Marland Kitchen Lack of Transportation (Non-Medical):   Physical Activity:   . Days of Exercise per Week:   . Minutes of Exercise per Session:   Stress:   . Feeling of Stress :   Social Connections:   . Frequency of Communication with Friends and Family:   . Frequency of Social Gatherings with Friends and Family:   . Attends Religious Services:   . Active Member of Clubs or Organizations:   . Attends Banker Meetings:   Marland Kitchen Marital Status:     Hospital Course:  From admission H&P: 44 year old male.  Resides [address],  a residential sober setting. Presented on 7/29, reporting worsening mood.  Reported he felt his prescribed medications ( Depakote, Zyprexa) are no longer working.  He states he last took his medications 3 days prior. He endorsed depression, suicidal thoughts with recent thoughts of cutting wrists, neurovegetative symptoms to include anhedonia, decreased sleep and appetite. He also reported irritability, anger, racing thoughts. He reports a history of alcohol and cocaine use disorder but states he has been sober for 140 days.  He does endorse increasing cravings over recent days. On presentation he appears irritable, angry, loud at times. He denies hallucinations and  does not appear psychotic at this time. He reports stressful job and upcoming court date for a probation violation as contributing stressors.  Ronald Brooks was admitted for depression with suicidal ideation. He was irritable during hospitalization and poorly cooperative with assessment. He declined to attend group therapy. He remained on the [facility] unit for four days. Depakote and Zyprexa were discontinued. He was started on Tegretol and Seroquel. He responded well to treatment with no adverse effects reported. He has shown improved mood, affect, sleep, and interaction. He denies any SI/HI/AVH and contracts for safety. He is discharging on the medications listed below. He agrees to follow [facility] with [facility]. Patient is provided with prescriptions and medication samples upon discharge. He is discharging to [facility] via [facility] transportation.  Physical Findings: AIMS:  , ,  ,  ,    CIWA:  CIWA-Ar Total: 3 COWS:  COWS Total Score: 4  Musculoskeletal: Strength & Muscle Tone: within normal limits Gait & Station: normal Patient leans: N/A  Psychiatric Specialty Exam: Physical Exam Vitals and nursing note reviewed.  Constitutional:      Appearance: He is well-developed.  Cardiovascular:     Rate and Rhythm: Normal rate.  Pulmonary:     Effort: Pulmonary effort is normal.  Neurological:     Mental Status: He is alert and oriented to person, place, and time.     Review of Systems  Constitutional: Negative.   Respiratory: Negative for cough and shortness of breath.   Psychiatric/Behavioral: Negative for agitation, behavioral problems, confusion, decreased concentration, dysphoric mood, hallucinations, self-injury, sleep disturbance and suicidal ideas. The patient is not nervous/anxious and is not hyperactive.     Blood pressure 110/78, pulse 77, temperature 97.7 F (36.5 C), temperature source Oral, resp. rate 18, height 5\' 11"  (1.803 m), weight (!) 97.5 kg, SpO2 98 %.Body  mass index is 29.99 kg/m.  See MD's discharge SRA      Has this patient used any form of tobacco in the last 30 days? (Cigarettes, Smokeless Tobacco, Cigars, and/or Pipes) Yes, a prescription for an FDA-approved medication for tobacco cessation was offered at discharge.   Blood Alcohol level:  Lab Results  Component Value Date   ETH <10 06/06/2020   ETH <10 09/28/2019    Metabolic Disorder Labs:  Lab Results  Component Value Date   HGBA1C 5.4 06/06/2020   MPG 108.28 06/06/2020   MPG 105.41 09/17/2019   Lab Results  Component Value Date   PROLACTIN 18.1 (H) 06/06/2020   Lab Results  Component Value Date   CHOL 232 (H) 06/06/2020   TRIG 194 (H) 06/06/2020   HDL 41 06/06/2020   CHOLHDL 5.7 06/06/2020   VLDL 39 06/06/2020   LDLCALC 152 (H) 06/06/2020    See Psychiatric Specialty Exam and Suicide Risk Assessment completed by Attending Physician prior to discharge.  Discharge destination:  Home  Is patient  on multiple antipsychotic therapies at discharge:  No   Has Patient had three or more failed trials of antipsychotic monotherapy by history:  No  Recommended Plan for Multiple Antipsychotic Therapies: NA  Discharge Instructions    Discharge instructions   Complete by: As directed    Patient is instructed to take all prescribed medications as recommended. Report any side effects or adverse reactions to your outpatient psychiatrist. Patient is instructed to abstain from alcohol and illegal drugs while on prescription medications. In the event of worsening symptoms, patient is instructed to call the crisis hotline, 911, or go to the nearest emergency department for evaluation and treatment.     Allergies as of 06/09/2020      Reactions   Other Shortness Of Breath   "dander"   Vitamin E Rash      Medication List    STOP taking these medications   albuterol 108 (90 Base) MCG/ACT inhaler Commonly known as: VENTOLIN HFA   divalproex 500 MG DR tablet Commonly  known as: DEPAKOTE   loratadine 10 MG tablet Commonly known as: CLARITIN   ramelteon 8 MG tablet Commonly known as: ROZEREM   ZyPREXA 10 MG tablet Generic drug: OLANZapine     TAKE these medications     Indication  carbamazepine 200 MG tablet Commonly known as: TEGRETOL Take 1 tablet (200 mg total) by mouth 2 (two) times daily.  Indication: Mood   hydrOXYzine 25 MG tablet Commonly known as: ATARAX/VISTARIL Take 1 tablet (25 mg total) by mouth 3 (three) times daily as needed for anxiety.  Indication: Feeling Anxious   nicotine 21 mg/24hr patch Commonly known as: NICODERM CQ - dosed in mg/24 hours Place 1 patch (21 mg total) onto the skin daily at 6 (six) AM. Start taking on: June 10, 2020  Indication: Nicotine Addiction   QUEtiapine 50 MG tablet Commonly known as: SEROQUEL Take 1 tablet (50 mg total) by mouth 2 (two) times daily.  Indication: Mood   traZODone 50 MG tablet Commonly known as: DESYREL Take 1 tablet (50 mg total) by mouth at bedtime as needed for sleep.  Indication: Trouble Sleeping       Follow-up Information    [facility] Follow up.   Why: Please contact this provider if you are interested in residential services.  Contact information: [address] [place], [place] [address]   Phone: [phone] Fax: [phone]              Follow-up recommendations: Activity as tolerated. Diet as recommended by primary care physician. Keep all scheduled follow-up appointments as recommended.   Comments:   Patient is instructed to take all prescribed medications as recommended. Report any side effects or adverse reactions to your outpatient psychiatrist. Patient is instructed to abstain from alcohol and illegal drugs while on prescription medications. In the event of worsening symptoms, patient is instructed to call the crisis hotline, 911, or go to the nearest emergency department for evaluation and treatment.  Signed: Aldean Baker,  NP 06/09/2020, 12:43 PM

## 2020-06-09 NOTE — Progress Notes (Signed)
Spiritual care group on grief and loss facilitated by chaplain Quetzalli Clos  Group Goal:  Support / Education around grief and loss Members engage in facilitated group support and psycho-social education.  Group Description:  Following introductions and group rules, group members engaged in facilitated group dialog and support around topic of loss, with particular support around experiences of loss in their lives. Group Identified types of loss (relationships / self / things) and identified patterns, circumstances, and changes that precipitate losses. Reflected on thoughts / feelings around loss, normalized grief responses, and recognized variety in grief experience. Patient Progress:  DID NOT ATTEND 

## 2020-06-09 NOTE — [facility] Suicide Risk Assessment (Signed)
[facility] Discharge Suicide Risk Assessment   Principal Problem: <principal problem not specified> Discharge Diagnoses: Active Problems:   Bipolar affective disorder, currently active (HCC)   Total Time spent with patient: 15 minutes  Musculoskeletal: Strength & Muscle Tone: within normal limits Gait & Station: normal Patient leans: N/A  Psychiatric Specialty Exam: Review of Systems  All other systems reviewed and are negative.   Blood pressure 110/78, pulse 77, temperature 97.7 F (36.5 C), temperature source Oral, resp. rate 18, height 5\' 11"  (1.803 m), weight (!) 97.5 kg, SpO2 98 %.Body mass index is 29.99 kg/m.  General Appearance: Casual  Eye Contact::  Fair  Speech:  Normal Rate409  Volume:  Normal  Mood:  Anxious  Affect:  Congruent  Thought Process:  Coherent and Descriptions of Associations: Circumstantial  Orientation:  Full (Time, Place, and Person)  Thought Content:  Logical  Suicidal Thoughts:  No  Homicidal Thoughts:  No  Memory:  Immediate;   Fair Recent;   Fair Remote;   Fair  Judgement:  Intact  Insight:  Fair  Psychomotor Activity:  Normal  Concentration:  Fair  Recall:  [phone] of Knowledge:Fair  Language: Fair  Akathisia:  Negative  Handed:  Right  AIMS (if indicated):     Assets:  Desire for Improvement Housing Resilience  Sleep:  Number of Hours: 6.75  Cognition: WNL  ADL's:  Intact   Mental Status Per Nursing Assessment::   On Admission:  Suicidal ideation indicated by patient, Self-harm thoughts  Demographic Factors:  Male, Caucasian, Low socioeconomic status and Unemployed  Loss Factors: Financial problems/change in socioeconomic status  Historical Factors: Impulsivity  Risk Reduction Factors:   Positive social support  Continued Clinical Symptoms:  Bipolar Disorder:   Depressive phase  Cognitive Features That Contribute To Risk:  None    Suicide Risk:  Minimal: No identifiable suicidal ideation.  Patients presenting  with no risk factors but with morbid ruminations; may be classified as minimal risk based on the severity of the depressive symptoms   Follow-up Information    [facility] Follow up.   Why: Please contact this provider if you are interested in residential services.  Contact information: [address] [place], [place]   Phone: [phone] Fax: [phone]              Plan Of Care/Follow-up recommendations:  Activity:  ad lib  [phone], MD 06/09/2020, 10:01 AM
